# Patient Record
Sex: Female | Born: 1980
Health system: Southern US, Community
[De-identification: ages and names within clinical notes are randomized; demographics above are authoritative.]

## PROBLEM LIST (undated history)

## (undated) DIAGNOSIS — U071 COVID-19: Secondary | ICD-10-CM

## (undated) DIAGNOSIS — D573 Sickle-cell trait: Secondary | ICD-10-CM

## (undated) DIAGNOSIS — Z8619 Personal history of other infectious and parasitic diseases: Secondary | ICD-10-CM

## (undated) HISTORY — PX: SCAR REVISION: SHX5285

## (undated) HISTORY — PX: WISDOM TOOTH EXTRACTION: SHX21

## (undated) HISTORY — PX: BUNIONECTOMY: SHX129

## (undated) HISTORY — DX: COVID-19: U07.1

## (undated) HISTORY — DX: Personal history of other infectious and parasitic diseases: Z86.19

## (undated) HISTORY — DX: Sickle-cell trait: D57.3

---

## 2011-07-16 LAB — GC/CHLAMYDIA PROBE AMP, GENITAL
Chlamydia: NEGATIVE
Gonorrhea: NEGATIVE

## 2011-07-16 LAB — RPR: RPR: NONREACTIVE

## 2011-07-16 LAB — ABO/RH

## 2011-08-02 ENCOUNTER — Other Ambulatory Visit (HOSPITAL_COMMUNITY)
Admission: RE | Admit: 2011-08-02 | Discharge: 2011-08-02 | Disposition: A | Discharge status: Home / Self Care | Payer: 59 | Source: Ambulatory Visit | Attending: Obstetrics and Gynecology | Admitting: Obstetrics and Gynecology

## 2011-08-02 DIAGNOSIS — Z01419 Encounter for gynecological examination (general) (routine) without abnormal findings: Secondary | ICD-10-CM | POA: Insufficient documentation

## 2011-08-02 DIAGNOSIS — Z113 Encounter for screening for infections with a predominantly sexual mode of transmission: Secondary | ICD-10-CM | POA: Insufficient documentation

## 2012-01-21 LAB — STREP B DNA PROBE: GBS: POSITIVE

## 2012-02-11 ENCOUNTER — Encounter (HOSPITAL_COMMUNITY): Payer: Self-pay | Admitting: *Deleted

## 2012-02-11 ENCOUNTER — Telehealth (HOSPITAL_COMMUNITY): Payer: Self-pay | Admitting: *Deleted

## 2012-02-11 NOTE — Telephone Encounter (Signed)
Preadmission screen  

## 2012-02-12 ENCOUNTER — Inpatient Hospital Stay (HOSPITAL_COMMUNITY): Admission: AD | Admit: 2012-02-12 | Payer: Self-pay | Source: Ambulatory Visit | Admitting: Obstetrics and Gynecology

## 2012-02-19 ENCOUNTER — Other Ambulatory Visit: Payer: Self-pay | Admitting: Obstetrics and Gynecology

## 2012-02-19 ENCOUNTER — Encounter (HOSPITAL_COMMUNITY): Payer: Self-pay

## 2012-02-19 ENCOUNTER — Inpatient Hospital Stay (HOSPITAL_COMMUNITY)
Admission: RE | Admit: 2012-02-19 | Discharge: 2012-02-23 | DRG: 766 | Disposition: A | Discharge status: Home / Self Care | Payer: 59 | Source: Ambulatory Visit | Attending: Obstetrics and Gynecology | Admitting: Obstetrics and Gynecology

## 2012-02-19 DIAGNOSIS — O480 Post-term pregnancy: Principal | ICD-10-CM | POA: Diagnosis present

## 2012-02-19 LAB — CBC
MCV: 90.6 fL (ref 78.0–100.0)
Platelets: 246 10*3/uL (ref 150–400)
RBC: 4.38 MIL/uL (ref 3.87–5.11)
RDW: 13.6 % (ref 11.5–15.5)
WBC: 10.5 10*3/uL (ref 4.0–10.5)

## 2012-02-19 MED ORDER — IBUPROFEN 600 MG PO TABS: 600.0000 mg | ORAL_TABLET | Freq: Four times a day (QID) | ORAL | Status: DC | PRN

## 2012-02-19 MED ORDER — CITRIC ACID-SODIUM CITRATE 334-500 MG/5ML PO SOLN
30.0000 mL | ORAL | Status: DC | PRN
  Filled 2012-02-19: qty 15

## 2012-02-19 MED ORDER — LACTATED RINGERS IV SOLN
500.0000 mL | INTRAVENOUS | Status: DC | PRN
  Administered 2012-02-20: 1000 mL via INTRAVENOUS

## 2012-02-19 MED ORDER — ONDANSETRON HCL 4 MG/2ML IJ SOLN: 4.0000 mg | Freq: Four times a day (QID) | INTRAMUSCULAR | Status: DC | PRN

## 2012-02-19 MED ORDER — MISOPROSTOL 25 MCG QUARTER TABLET
25.0000 ug | ORAL_TABLET | ORAL | Status: DC | PRN
  Administered 2012-02-19: 25 ug via VAGINAL
  Filled 2012-02-19: qty 0.25

## 2012-02-19 MED ORDER — ACETAMINOPHEN 325 MG PO TABS: 650.0000 mg | ORAL_TABLET | ORAL | Status: DC | PRN

## 2012-02-19 MED ORDER — OXYTOCIN 20 UNITS IN LACTATED RINGERS INFUSION - SIMPLE: 125.0000 mL/h | Freq: Once | INTRAVENOUS | Status: DC

## 2012-02-19 MED ORDER — BUTORPHANOL TARTRATE 2 MG/ML IJ SOLN
1.0000 mg | INTRAMUSCULAR | Status: DC | PRN
  Administered 2012-02-20 (×2): 1 mg via INTRAVENOUS
  Filled 2012-02-19 (×2): qty 1

## 2012-02-19 MED ORDER — PENICILLIN G POTASSIUM 5000000 UNITS IJ SOLR
2.5000 10*6.[IU] | INTRAVENOUS | Status: DC
  Administered 2012-02-20 (×3): 2.5 10*6.[IU] via INTRAVENOUS
  Filled 2012-02-19 (×5): qty 2.5

## 2012-02-19 MED ORDER — LIDOCAINE HCL (PF) 1 % IJ SOLN: 30.0000 mL | INTRAMUSCULAR | Status: DC | PRN

## 2012-02-19 MED ORDER — LACTATED RINGERS IV SOLN
INTRAVENOUS | Status: DC
  Administered 2012-02-19 – 2012-02-20 (×2): via INTRAVENOUS
  Administered 2012-02-20: 125 mL/h via INTRAVENOUS

## 2012-02-19 MED ORDER — PENICILLIN G POTASSIUM 5000000 UNITS IJ SOLR
5.0000 10*6.[IU] | Freq: Once | INTRAMUSCULAR | Status: AC
  Administered 2012-02-20: 5 10*6.[IU] via INTRAVENOUS
  Filled 2012-02-19: qty 5

## 2012-02-19 MED ORDER — TERBUTALINE SULFATE 1 MG/ML IJ SOLN: 0.2500 mg | Freq: Once | INTRAMUSCULAR | Status: AC | PRN

## 2012-02-19 MED ORDER — FLEET ENEMA 7-19 GM/118ML RE ENEM: 1.0000 | ENEMA | RECTAL | Status: DC | PRN

## 2012-02-19 MED ORDER — OXYTOCIN BOLUS FROM INFUSION
500.0000 mL | Freq: Once | INTRAVENOUS | Status: DC
  Filled 2012-02-19: qty 500

## 2012-02-19 NOTE — Progress Notes (Signed)
Dr. Christell Constant contacted for patient update to include SVE, FHR, and maternal vitals.  Penicillin to start once patient is in active labor.  No new orders received.

## 2012-02-20 ENCOUNTER — Encounter (HOSPITAL_COMMUNITY): Payer: Self-pay

## 2012-02-20 ENCOUNTER — Inpatient Hospital Stay (HOSPITAL_COMMUNITY): Payer: 59 | Admitting: Anesthesiology

## 2012-02-20 ENCOUNTER — Encounter (HOSPITAL_COMMUNITY): Payer: Self-pay | Admitting: Anesthesiology

## 2012-02-20 ENCOUNTER — Encounter (HOSPITAL_COMMUNITY)
Admission: RE | Disposition: A | Discharge status: Home / Self Care | Payer: Self-pay | Source: Ambulatory Visit | Attending: Obstetrics and Gynecology

## 2012-02-20 LAB — RPR: RPR Ser Ql: NONREACTIVE

## 2012-02-20 SURGERY — Surgical Case
Anesthesia: Epidural | Site: Abdomen | Wound class: Clean Contaminated

## 2012-02-20 MED ORDER — LACTATED RINGERS IV SOLN
500.0000 mL | Freq: Once | INTRAVENOUS | Status: AC
  Administered 2012-02-20: 1000 mL via INTRAVENOUS

## 2012-02-20 MED ORDER — CITRIC ACID-SODIUM CITRATE 334-500 MG/5ML PO SOLN
30.0000 mL | Freq: Once | ORAL | Status: AC
  Administered 2012-02-20: 30 mL via ORAL

## 2012-02-20 MED ORDER — SODIUM BICARBONATE 8.4 % IV SOLN
INTRAVENOUS | Status: AC
  Filled 2012-02-20: qty 50

## 2012-02-20 MED ORDER — SODIUM BICARBONATE 8.4 % IV SOLN
INTRAVENOUS | Status: DC | PRN
  Administered 2012-02-20: 5 mL via EPIDURAL

## 2012-02-20 MED ORDER — OXYTOCIN 10 UNIT/ML IJ SOLN
INTRAMUSCULAR | Status: AC
  Filled 2012-02-20: qty 2

## 2012-02-20 MED ORDER — LIDOCAINE-EPINEPHRINE (PF) 2 %-1:200000 IJ SOLN
INTRAMUSCULAR | Status: AC
  Filled 2012-02-20: qty 20

## 2012-02-20 MED ORDER — LACTATED RINGERS IV SOLN
INTRAVENOUS | Status: DC | PRN
  Administered 2012-02-20 – 2012-02-21 (×3): via INTRAVENOUS

## 2012-02-20 MED ORDER — TERBUTALINE SULFATE 1 MG/ML IJ SOLN: 0.2500 mg | Freq: Once | INTRAMUSCULAR | Status: DC | PRN

## 2012-02-20 MED ORDER — CEFAZOLIN SODIUM 1-5 GM-% IV SOLN
INTRAVENOUS | Status: DC | PRN
  Administered 2012-02-20: 1 g via INTRAVENOUS

## 2012-02-20 MED ORDER — OXYTOCIN 20 UNITS IN LACTATED RINGERS INFUSION - SIMPLE
1.0000 m[IU]/min | INTRAVENOUS | Status: DC
  Administered 2012-02-20: 2 m[IU]/min via INTRAVENOUS
  Filled 2012-02-20: qty 1000

## 2012-02-20 MED ORDER — EPHEDRINE 5 MG/ML INJ: 10.0000 mg | INTRAVENOUS | Status: DC | PRN

## 2012-02-20 MED ORDER — METHYLERGONOVINE MALEATE 0.2 MG/ML IJ SOLN
INTRAMUSCULAR | Status: AC
  Filled 2012-02-20: qty 1

## 2012-02-20 MED ORDER — CEFAZOLIN SODIUM 1-5 GM-% IV SOLN
1.0000 g | INTRAVENOUS | Status: DC
  Filled 2012-02-20: qty 50

## 2012-02-20 MED ORDER — OXYTOCIN 20 UNITS IN LACTATED RINGERS INFUSION - SIMPLE
INTRAVENOUS | Status: DC | PRN
  Administered 2012-02-20: 40 [IU] via INTRAVENOUS
  Administered 2012-02-21: 20 [IU] via INTRAVENOUS

## 2012-02-20 MED ORDER — ONDANSETRON HCL 4 MG/2ML IJ SOLN
INTRAMUSCULAR | Status: AC
  Filled 2012-02-20: qty 2

## 2012-02-20 MED ORDER — METHYLERGONOVINE MALEATE 0.2 MG/ML IJ SOLN
INTRAMUSCULAR | Status: DC | PRN
  Administered 2012-02-20: 0.2 mg via INTRAMUSCULAR

## 2012-02-20 MED ORDER — MORPHINE SULFATE 0.5 MG/ML IJ SOLN
INTRAMUSCULAR | Status: AC
  Filled 2012-02-20: qty 10

## 2012-02-20 MED ORDER — MORPHINE SULFATE (PF) 0.5 MG/ML IJ SOLN
INTRAMUSCULAR | Status: DC | PRN
  Administered 2012-02-20: 1 mg via INTRAVENOUS

## 2012-02-20 MED ORDER — PHENYLEPHRINE 40 MCG/ML (10ML) SYRINGE FOR IV PUSH (FOR BLOOD PRESSURE SUPPORT): 80.0000 ug | PREFILLED_SYRINGE | INTRAVENOUS | Status: DC | PRN

## 2012-02-20 MED ORDER — MORPHINE SULFATE (PF) 0.5 MG/ML IJ SOLN
INTRAMUSCULAR | Status: DC | PRN
  Administered 2012-02-20: 4 mg via EPIDURAL

## 2012-02-20 MED ORDER — ONDANSETRON HCL 4 MG/2ML IJ SOLN
INTRAMUSCULAR | Status: DC | PRN
  Administered 2012-02-20: 4 mg via INTRAVENOUS

## 2012-02-20 MED ORDER — DIPHENHYDRAMINE HCL 50 MG/ML IJ SOLN: 12.5000 mg | INTRAMUSCULAR | Status: DC | PRN

## 2012-02-20 MED ORDER — LIDOCAINE HCL (PF) 1 % IJ SOLN
INTRAMUSCULAR | Status: DC | PRN
  Administered 2012-02-20 (×3): 4 mL

## 2012-02-20 MED ORDER — PHENYLEPHRINE 40 MCG/ML (10ML) SYRINGE FOR IV PUSH (FOR BLOOD PRESSURE SUPPORT)
80.0000 ug | PREFILLED_SYRINGE | INTRAVENOUS | Status: DC | PRN
  Filled 2012-02-20: qty 5

## 2012-02-20 MED ORDER — FENTANYL 2.5 MCG/ML BUPIVACAINE 1/10 % EPIDURAL INFUSION (WH - ANES)
14.0000 mL/h | INTRAMUSCULAR | Status: DC
  Administered 2012-02-20 (×3): 14 mL/h via EPIDURAL
  Filled 2012-02-20 (×3): qty 60

## 2012-02-20 SURGICAL SUPPLY — 37 items
BENZOIN TINCTURE PRP APPL 2/3 (GAUZE/BANDAGES/DRESSINGS) ×2 IMPLANT
CLOTH BEACON ORANGE TIMEOUT ST (SAFETY) ×2 IMPLANT
CONTAINER PREFILL 10% NBF 15ML (MISCELLANEOUS) IMPLANT
DRESSING TELFA 8X3 (GAUZE/BANDAGES/DRESSINGS) ×2 IMPLANT
ELECT REM PT RETURN 9FT ADLT (ELECTROSURGICAL) ×2
ELECTRODE REM PT RTRN 9FT ADLT (ELECTROSURGICAL) ×1 IMPLANT
EXTRACTOR VACUUM M CUP 4 TUBE (SUCTIONS) IMPLANT
GAUZE SPONGE 4X4 12PLY STRL LF (GAUZE/BANDAGES/DRESSINGS) ×2 IMPLANT
GLOVE BIOGEL M 6.5 STRL (GLOVE) ×2 IMPLANT
GLOVE BIOGEL PI IND STRL 6.5 (GLOVE) ×2 IMPLANT
GLOVE BIOGEL PI INDICATOR 6.5 (GLOVE) ×2
GOWN PREVENTION PLUS LG XLONG (DISPOSABLE) ×2 IMPLANT
GOWN PREVENTION PLUS XLARGE (GOWN DISPOSABLE) ×2 IMPLANT
KIT ABG SYR 3ML LUER SLIP (SYRINGE) IMPLANT
NEEDLE HYPO 25X5/8 SAFETYGLIDE (NEEDLE) IMPLANT
NS IRRIG 1000ML POUR BTL (IV SOLUTION) ×2 IMPLANT
PACK C SECTION WH (CUSTOM PROCEDURE TRAY) ×2 IMPLANT
PAD ABD 7.5X8 STRL (GAUZE/BANDAGES/DRESSINGS) ×2 IMPLANT
RTRCTR C-SECT PINK 25CM LRG (MISCELLANEOUS) IMPLANT
RTRCTR C-SECT PINK 34CM XLRG (MISCELLANEOUS) IMPLANT
SLEEVE SCD COMPRESS KNEE MED (MISCELLANEOUS) IMPLANT
SPONGE GAUZE 4X4 12PLY (GAUZE/BANDAGES/DRESSINGS) ×2 IMPLANT
STAPLER VISISTAT 35W (STAPLE) IMPLANT
STRIP CLOSURE SKIN 1/2X4 (GAUZE/BANDAGES/DRESSINGS) ×2 IMPLANT
SUT PDS AB 0 CT1 27 (SUTURE) ×4 IMPLANT
SUT PLAIN 0 NONE (SUTURE) IMPLANT
SUT VIC AB 0 CTX 36 (SUTURE) ×3
SUT VIC AB 0 CTX36XBRD ANBCTRL (SUTURE) ×3 IMPLANT
SUT VIC AB 2-0 CT1 27 (SUTURE) ×1
SUT VIC AB 2-0 CT1 TAPERPNT 27 (SUTURE) ×1 IMPLANT
SUT VIC AB 3-0 SH 27 (SUTURE)
SUT VIC AB 3-0 SH 27X BRD (SUTURE) IMPLANT
SUT VIC AB 4-0 KS 27 (SUTURE) ×2 IMPLANT
TAPE CLOTH SURG 4X10 WHT LF (GAUZE/BANDAGES/DRESSINGS) ×2 IMPLANT
TOWEL OR 17X24 6PK STRL BLUE (TOWEL DISPOSABLE) ×4 IMPLANT
TRAY FOLEY CATH 14FR (SET/KITS/TRAYS/PACK) ×2 IMPLANT
WATER STERILE IRR 1000ML POUR (IV SOLUTION) ×2 IMPLANT

## 2012-02-20 NOTE — Progress Notes (Signed)
Dr. Richardson Dopp called to check on patient.  Notified that PCN started and maternal vitals, SVE, UC's and FHR.  No new orders received.  Dr. Richardson Dopp will be in within the hour to evaluate in person.

## 2012-02-20 NOTE — Op Note (Signed)
Cesarean Section Procedure Note  Indications: failure to progress: arrest of dilation  Pre-operative Diagnosis: 41 week 1 day pregnancy.  Post-operative Diagnosis: same  Surgeon: Jessee Avers.   Assistants: Dr. Geryl Rankins   Anesthesia: Epidural anesthesia  ASA Class: 2   Procedure Details   The patient was seen in the Holding Room. The risks, benefits, complications, treatment options, and expected outcomes were discussed with the patient.  The patient concurred with the proposed plan, giving informed consent.  The site of surgery properly noted/marked. The patient was taken to Operating Room # 1, identified as Carolyn Bush and the procedure verified as C-Section Delivery. A Time Out was held and the above information confirmed.  After induction of anesthesia, the patient was draped and prepped in the usual sterile manner. A Pfannenstiel incision was made and carried down through the subcutaneous tissue to the fascia. Fascial incision was made and extended transversely. The fascia was separated from the underlying rectus tissue superiorly and inferiorly. The peritoneum was identified and entered. Peritoneal incision was extended longitudinally. The utero-vesical peritoneal reflection was incised transversely and the bladder flap was bluntly freed from the lower uterine segment. A low transverse uterine incision was made. Delivered from cephalic presentation was Female with Apgar scores of 9 at one minute and 9 at five minutes. After the umbilical cord was clamped and cut cord blood was obtained for evaluation. The placenta was removed intact and appeared normal. The uterine outline, tubes and ovaries appeared normal. The uterine incision was closed with running locked sutures of 0 Vicryl. Hemostasis was observed. A second layer of 0 vicryl was used for imbrication . Lavage was carried out until clear. The fascia was then reapproximated with running sutures of 0 pds. The skin was  reapproximated with 4-0  Vicryl.  Instrument, sponge, and needle counts were correct prior the abdominal closure and at the conclusion of the case.   Findings:  female infant cephalic position LGA Estimated Blood Loss:  750 ml         Drains: Foley catheter         Total IV Fluids:          Specimens: Placenta sent to labor and delivery           Implants: none         Complications:  None; patient tolerated the procedure well.         Disposition: PACU - hemodynamically stable.         Condition: stable  Attending Attestation: I performed the procedure.

## 2012-02-20 NOTE — Anesthesia Procedure Notes (Signed)
Epidural Patient location during procedure: OB Start time: 02/20/2012 11:41 AM Reason for block: procedure for pain  Staffing Performed by: anesthesiologist   Preanesthetic Checklist Completed: patient identified, site marked, surgical consent, pre-op evaluation, timeout performed, IV checked, risks and benefits discussed and monitors and equipment checked  Epidural Patient position: sitting Prep: site prepped and draped and DuraPrep Patient monitoring: continuous pulse ox and blood pressure Approach: midline Injection technique: LOR air  Needle:  Needle type: Tuohy  Needle gauge: 17 G Needle length: 9 cm Catheter type: closed end flexible Catheter size: 19 Gauge Test dose: negative  Assessment Events: blood not aspirated, injection not painful, no injection resistance, negative IV test and no paresthesia  Additional Notes Discussed risk of headache, infection, bleeding, nerve injury and failed or incomplete block.  Patient voices understanding and wishes to proceed.

## 2012-02-20 NOTE — Progress Notes (Signed)
In to assess patient  cx 9/100/0 station with caput FHR baseline 120's good btbv + accels no decels  Toco  Ctx q 2 minutes mvu 210 A/p 41wks and 1 day with arrest of dilatation Recommend cesarean section R/B/A of cesarean section discussed with patient including but not limited to infection bleeding damage to bowel bladder and baby with the need for further surgery. R/O transfusion HIV/ Hep B&C discussed . Pt voiced understanding and desires to proceed with cesarean section.

## 2012-02-20 NOTE — Progress Notes (Signed)
Dr. Christell Constant notified of SVE and UC's.  No new orders received.  Nurse advised to go ahead and start PCN dose for GBS results.

## 2012-02-20 NOTE — Progress Notes (Signed)
In to assess patient. Pt is comfortable with an epidural  AF VSS FHR baseline 120's good btbv +accels no decels Toco ctx q 3-6 min Cx 4/80/-2 IUPC placed without difficulty  A/P 41 wks and 1day postdates  Pitocin for augmentation

## 2012-02-20 NOTE — H&P (Signed)
Carolyn Bush is a 31 y.o. female presenting for  Induction at 41 wks due to post dates. Pt received cytotec 25 mcg over night. + ctx no Lof novaginal bleeding . +FM  OB History    Grav Para Term Preterm Abortions TAB SAB Ect Mult Living   1              Past Medical History  Diagnosis Date  . History of chicken pox   . Sickle cell trait   . Asthma   Medication: PNV.Marland Kitchen Pt's asthma is exercised induced only.. .  History reviewed. No pertinent past surgical history. Family History: family history includes Birth defects in her brother; Seizures in her brother; and Thyroid disease in her maternal grandmother. Social History:  reports that she has never smoked. She does not have any smokeless tobacco history on file. She reports that she does not drink alcohol or use illicit drugs. Surgery : scar revision and wisdom tooth extraction  Ros: Negative except as stated in HPI  ALLErgies NKDA no latex allergy  Dilation: 3 Effacement (%): 70 Station: -1 Exam by:: Bennye Alm, RN Blood pressure 98/63, pulse 65, temperature 97.5 F (36.4 C), temperature source Oral, resp. rate 18, height 5\' 7"  (1.702 m), weight 73.029 kg (161 lb), last menstrual period 04/23/2011. CV rrr  Lungs clear Abd gravid nontender Ext no edema  Cx 3/75/-2 AROM clear fluid    Prenatal labs: ABO, Rh: O/Positive/-- (09/10 0000) Antibody: Negative (09/10 0000) Rubella:  Immune  RPR: NON REACTIVE (04/16 2110)  HBsAg: Negative (09/10 0000)  HIV: Non-reactive (09/10 0000)  GBS: Positive (03/18 0000)   Assessment/Plan: 41 wks post dates for induction  Penicillin for GBS prophylaxis  Arom performed if no change in cervix in 2 hrs will start pitocin Anticipate svd    Morgen Linebaugh J. 02/20/2012, 8:15 AM

## 2012-02-20 NOTE — Progress Notes (Signed)
In to assess patient  Cx 8/100/0 FHR baseline 120's good btbv + accels variable decel Toco ctx q2 minutes  A/P 41 wks and 1 day induction for postdates making progress  pcn for gbs Continue pitocin  Anticipate SVD

## 2012-02-20 NOTE — Anesthesia Preprocedure Evaluation (Signed)
Anesthesia Evaluation  Patient identified by MRN, date of birth, ID band Patient awake    Reviewed: Allergy & Precautions, H&P , NPO status , Patient's Chart, lab work & pertinent test results, reviewed documented beta blocker date and time   History of Anesthesia Complications Negative for: history of anesthetic complications  Airway Mallampati: I TM Distance: >3 FB Neck ROM: full    Dental  (+) Teeth Intact   Pulmonary neg pulmonary ROS,  breath sounds clear to auscultation        Cardiovascular negative cardio ROS  Rhythm:regular Rate:Normal     Neuro/Psych negative neurological ROS  negative psych ROS   GI/Hepatic negative GI ROS, Neg liver ROS,   Endo/Other  negative endocrine ROS  Renal/GU negative Renal ROS     Musculoskeletal   Abdominal   Peds  Hematology negative hematology ROS (+) Sickle cell trait ,   Anesthesia Other Findings   Reproductive/Obstetrics (+) Pregnancy                           Anesthesia Physical Anesthesia Plan  ASA: II  Anesthesia Plan: Epidural   Post-op Pain Management:    Induction:   Airway Management Planned:   Additional Equipment:   Intra-op Plan:   Post-operative Plan:   Informed Consent: I have reviewed the patients History and Physical, chart, labs and discussed the procedure including the risks, benefits and alternatives for the proposed anesthesia with the patient or authorized representative who has indicated his/her understanding and acceptance.     Plan Discussed with:   Anesthesia Plan Comments:         Anesthesia Quick Evaluation

## 2012-02-20 NOTE — Progress Notes (Signed)
In to assess patient Cervix 9/100/ +1 station  FHR baseline 120's good btbv +accels variable decel occasionally Toco ctx q 2 min mvu 190 A/P 41 wks and 1 day post dates  Anticipate svd

## 2012-02-20 NOTE — Progress Notes (Signed)
Dr. Christell Constant notified of cervical exam and UC's.  New order received to wait on 2nd cytotec and re-evaluate at 0315.  May place cytotec at that time if needed and UC's are less than 3 in a 10 minute period.  Will contact Dr. Christell Constant at 0600 with SVE to determine additional orders.

## 2012-02-20 NOTE — Progress Notes (Signed)
In to assess patient  Cx 9/100/0 FHR baseline 120's good btbv +accels occasional variable decel Toco ctx q 2 1/2 minutes  mvu 190 A/P 41 wks 1 day postdates Continue pitocin  Will monitor closely

## 2012-02-21 ENCOUNTER — Encounter (HOSPITAL_COMMUNITY): Payer: Self-pay

## 2012-02-21 LAB — CBC
Hemoglobin: 11.7 g/dL — ABNORMAL LOW (ref 12.0–15.0)
MCH: 30.3 pg (ref 26.0–34.0)
MCV: 90.9 fL (ref 78.0–100.0)
Platelets: 220 10*3/uL (ref 150–400)
RBC: 3.86 MIL/uL — ABNORMAL LOW (ref 3.87–5.11)
WBC: 28.1 10*3/uL — ABNORMAL HIGH (ref 4.0–10.5)

## 2012-02-21 LAB — ABO/RH: ABO/RH(D): O POS

## 2012-02-21 MED ORDER — DIPHENHYDRAMINE HCL 50 MG/ML IJ SOLN: 12.5000 mg | INTRAMUSCULAR | Status: DC | PRN

## 2012-02-21 MED ORDER — SIMETHICONE 80 MG PO CHEW
80.0000 mg | CHEWABLE_TABLET | Freq: Three times a day (TID) | ORAL | Status: DC
  Administered 2012-02-21 – 2012-02-23 (×9): 80 mg via ORAL

## 2012-02-21 MED ORDER — FERROUS SULFATE 325 (65 FE) MG PO TABS
325.0000 mg | ORAL_TABLET | Freq: Two times a day (BID) | ORAL | Status: DC
  Administered 2012-02-21 – 2012-02-23 (×4): 325 mg via ORAL
  Filled 2012-02-21 (×4): qty 1

## 2012-02-21 MED ORDER — METOCLOPRAMIDE HCL 5 MG/ML IJ SOLN: 10.0000 mg | Freq: Three times a day (TID) | INTRAMUSCULAR | Status: DC | PRN

## 2012-02-21 MED ORDER — METHYLERGONOVINE MALEATE 0.2 MG PO TABS: 0.2000 mg | ORAL_TABLET | ORAL | Status: DC | PRN

## 2012-02-21 MED ORDER — KETOROLAC TROMETHAMINE 60 MG/2ML IM SOLN
60.0000 mg | Freq: Once | INTRAMUSCULAR | Status: AC | PRN
  Administered 2012-02-21: 60 mg via INTRAMUSCULAR

## 2012-02-21 MED ORDER — PRENATAL MULTIVITAMIN CH
1.0000 | ORAL_TABLET | Freq: Every day | ORAL | Status: DC
  Administered 2012-02-21 – 2012-02-23 (×3): 1 via ORAL
  Filled 2012-02-21 (×3): qty 1

## 2012-02-21 MED ORDER — KETOROLAC TROMETHAMINE 60 MG/2ML IM SOLN
INTRAMUSCULAR | Status: AC
  Filled 2012-02-21: qty 2

## 2012-02-21 MED ORDER — LANOLIN HYDROUS EX OINT: 1.0000 "application " | TOPICAL_OINTMENT | CUTANEOUS | Status: DC | PRN

## 2012-02-21 MED ORDER — MEPERIDINE HCL 25 MG/ML IJ SOLN: 6.2500 mg | INTRAMUSCULAR | Status: DC | PRN

## 2012-02-21 MED ORDER — SCOPOLAMINE 1 MG/3DAYS TD PT72
1.0000 | MEDICATED_PATCH | Freq: Once | TRANSDERMAL | Status: DC
  Administered 2012-02-21: 1.5 mg via TRANSDERMAL

## 2012-02-21 MED ORDER — ZOLPIDEM TARTRATE 5 MG PO TABS: 5.0000 mg | ORAL_TABLET | Freq: Every evening | ORAL | Status: DC | PRN

## 2012-02-21 MED ORDER — ONDANSETRON HCL 4 MG PO TABS: 4.0000 mg | ORAL_TABLET | ORAL | Status: DC | PRN

## 2012-02-21 MED ORDER — NALBUPHINE SYRINGE 5 MG/0.5 ML
5.0000 mg | INJECTION | INTRAMUSCULAR | Status: DC | PRN
  Filled 2012-02-21: qty 1

## 2012-02-21 MED ORDER — OXYTOCIN 20 UNITS IN LACTATED RINGERS INFUSION - SIMPLE: 125.0000 mL/h | INTRAVENOUS | Status: AC

## 2012-02-21 MED ORDER — WITCH HAZEL-GLYCERIN EX PADS: 1.0000 "application " | MEDICATED_PAD | CUTANEOUS | Status: DC | PRN

## 2012-02-21 MED ORDER — ONDANSETRON HCL 4 MG/2ML IJ SOLN: 4.0000 mg | Freq: Three times a day (TID) | INTRAMUSCULAR | Status: DC | PRN

## 2012-02-21 MED ORDER — KETOROLAC TROMETHAMINE 30 MG/ML IJ SOLN: 30.0000 mg | Freq: Four times a day (QID) | INTRAMUSCULAR | Status: AC | PRN

## 2012-02-21 MED ORDER — SIMETHICONE 80 MG PO CHEW: 80.0000 mg | CHEWABLE_TABLET | ORAL | Status: DC | PRN

## 2012-02-21 MED ORDER — SCOPOLAMINE 1 MG/3DAYS TD PT72
MEDICATED_PATCH | TRANSDERMAL | Status: AC
  Filled 2012-02-21: qty 1

## 2012-02-21 MED ORDER — SODIUM CHLORIDE 0.9 % IV SOLN
1.0000 ug/kg/h | INTRAVENOUS | Status: DC | PRN
  Filled 2012-02-21: qty 2.5

## 2012-02-21 MED ORDER — SODIUM CHLORIDE 0.9 % IJ SOLN: 3.0000 mL | INTRAMUSCULAR | Status: DC | PRN

## 2012-02-21 MED ORDER — DIBUCAINE 1 % RE OINT: 1.0000 "application " | TOPICAL_OINTMENT | RECTAL | Status: DC | PRN

## 2012-02-21 MED ORDER — OXYCODONE-ACETAMINOPHEN 5-325 MG PO TABS: 1.0000 | ORAL_TABLET | ORAL | Status: DC | PRN

## 2012-02-21 MED ORDER — MENTHOL 3 MG MT LOZG: 1.0000 | LOZENGE | OROMUCOSAL | Status: DC | PRN

## 2012-02-21 MED ORDER — NALBUPHINE SYRINGE 5 MG/0.5 ML
5.0000 mg | INJECTION | INTRAMUSCULAR | Status: DC | PRN
  Administered 2012-02-21: 10 mg via SUBCUTANEOUS
  Filled 2012-02-21 (×2): qty 1

## 2012-02-21 MED ORDER — DIPHENHYDRAMINE HCL 25 MG PO CAPS
25.0000 mg | ORAL_CAPSULE | ORAL | Status: DC | PRN
  Administered 2012-02-21: 25 mg via ORAL
  Filled 2012-02-21: qty 1

## 2012-02-21 MED ORDER — SENNOSIDES-DOCUSATE SODIUM 8.6-50 MG PO TABS
2.0000 | ORAL_TABLET | Freq: Every day | ORAL | Status: DC
  Administered 2012-02-21 – 2012-02-22 (×2): 2 via ORAL

## 2012-02-21 MED ORDER — NALOXONE HCL 0.4 MG/ML IJ SOLN: 0.4000 mg | INTRAMUSCULAR | Status: DC | PRN

## 2012-02-21 MED ORDER — ONDANSETRON HCL 4 MG/2ML IJ SOLN: 4.0000 mg | INTRAMUSCULAR | Status: DC | PRN

## 2012-02-21 MED ORDER — METHYLERGONOVINE MALEATE 0.2 MG/ML IJ SOLN
0.2000 mg | INTRAMUSCULAR | Status: DC | PRN
Start: 2012-02-21 — End: 2012-02-23

## 2012-02-21 MED ORDER — DIPHENHYDRAMINE HCL 25 MG PO CAPS: 25.0000 mg | ORAL_CAPSULE | Freq: Four times a day (QID) | ORAL | Status: DC | PRN

## 2012-02-21 MED ORDER — IBUPROFEN 600 MG PO TABS
600.0000 mg | ORAL_TABLET | Freq: Four times a day (QID) | ORAL | Status: DC
  Administered 2012-02-21 – 2012-02-23 (×10): 600 mg via ORAL
  Filled 2012-02-21 (×10): qty 1

## 2012-02-21 MED ORDER — TETANUS-DIPHTH-ACELL PERTUSSIS 5-2.5-18.5 LF-MCG/0.5 IM SUSP: 0.5000 mL | Freq: Once | INTRAMUSCULAR | Status: DC

## 2012-02-21 MED ORDER — DIPHENHYDRAMINE HCL 50 MG/ML IJ SOLN: 25.0000 mg | INTRAMUSCULAR | Status: DC | PRN

## 2012-02-21 MED ORDER — LACTATED RINGERS IV SOLN
INTRAVENOUS | Status: DC
  Administered 2012-02-21: 07:00:00 via INTRAVENOUS

## 2012-02-21 MED ORDER — IBUPROFEN 600 MG PO TABS: 600.0000 mg | ORAL_TABLET | Freq: Four times a day (QID) | ORAL | Status: DC | PRN

## 2012-02-21 MED ORDER — HYDROMORPHONE HCL PF 1 MG/ML IJ SOLN: 0.2500 mg | INTRAMUSCULAR | Status: DC | PRN

## 2012-02-21 NOTE — Anesthesia Postprocedure Evaluation (Signed)
  Anesthesia Post-op Note  Patient: Carolyn Bush  Procedure(s) Performed: Procedure(s) (LRB): CESAREAN SECTION (N/A)  Patient is awake, responsive, moving her legs, and has signs of resolution of her numbness. Pain and nausea are reasonably well controlled. Vital signs are stable and clinically acceptable. Oxygen saturation is clinically acceptable. There are no apparent anesthetic complications at this time. Patient is ready for discharge.

## 2012-02-21 NOTE — Progress Notes (Signed)
Post Partum Day 1 s/p Cesarean Section  Subjective: no complaints, up ad lib, voiding and tolerating PO  Objective: Blood pressure 107/69, pulse 71, temperature 98.3 F (36.8 C), temperature source Oral, resp. rate 18, height 5\' 7"  (1.702 m), weight 73.029 kg (161 lb), last menstrual period 04/23/2011, SpO2 98.00%, unknown if currently breastfeeding.  Physical Exam:  General: alert and cooperative Lochia: appropriate Uterine Fundus: firm Incision: Bandage clean/ dry / intact  DVT Evaluation: No evidence of DVT seen on physical exam.   Basename 02/21/12 0515 02/19/12 2110  HGB 11.7* 13.4  HCT 35.1* 39.7    Assessment/Plan: Breastfeeding and Circumcision prior to discharge I discussed with the patient r/o circumcision including but not limited to infection/ bleeding / damage to penis and poor cosmesis with the need for further surgery. She was informed that this is not medically necessary but for cosmetic purposes only. She voices understanding and desires to proceed with Circumcision    LOS: 2 days   Marvelene Stoneberg J. 02/21/2012, 1:43 PM

## 2012-02-21 NOTE — Transfer of Care (Signed)
Immediate Anesthesia Transfer of Care Note  Patient: Carolyn Bush  Procedure(s) Performed: Procedure(s) (LRB): CESAREAN SECTION (N/A)  Patient Location: PACU  Anesthesia Type: Epidural  Level of Consciousness: awake, alert , oriented and patient cooperative  Airway & Oxygen Therapy: Patient Spontanous Breathing  Post-op Assessment: Report given to PACU RN and Post -op Vital signs reviewed and stable  Post vital signs: Reviewed and stable  Complications: No apparent anesthesia complications

## 2012-02-21 NOTE — Anesthesia Postprocedure Evaluation (Signed)
Anesthesia Post Note  Patient: Carolyn Bush  Procedure(s) Performed: Procedure(s) (LRB): CESAREAN SECTION (N/A)  Anesthesia type: Epidural  Patient location: Mother/Baby  Post pain: Pain level controlled  Post assessment: Post-op Vital signs reviewed  Last Vitals:  Filed Vitals:   02/21/12 0610  BP: 112/71  Pulse: 93  Temp:   Resp: 20    Post vital signs: Reviewed  Level of consciousness:alert  Complications: No apparent anesthesia complications

## 2012-02-21 NOTE — Addendum Note (Signed)
Addendum  created 02/21/12 0745 by Algis Greenhouse, CRNA   Modules edited:Notes Section

## 2012-02-22 NOTE — Discharge Instructions (Signed)

## 2012-02-22 NOTE — Progress Notes (Signed)
Subjective: Postpartum Day 2: Cesarean Delivery Patient reports tolerating PO and no problems voiding.  Pain controlled with medications.  Breast feeding well.  Objective: Vital signs in last 24 hours: Temp:  [97.5 F (36.4 C)-98.4 F (36.9 C)] 97.5 F (36.4 C) (04/19 0549) Pulse Rate:  [65-78] 65  (04/19 0549) Resp:  [16-20] 18  (04/19 0549) BP: (94-111)/(54-70) 94/54 mmHg (04/19 0549) SpO2:  [99 %] 99 % (04/18 2150)  Physical Exam:  General: alert, cooperative and no distress Lochia: Not assessed at perineum Uterine Fundus: firm Incision: no significant drainage, steri strips minimally soiled DVT Evaluation: No cords or calf tenderness. Calf/Ankle edema is present.   Basename 02/21/12 0515 02/19/12 2110  HGB 11.7* 13.4  HCT 35.1* 39.7    Assessment/Plan: Status post Cesarean section. Doing well postoperatively.  Continue current care. Discharge home tomorrow. Encouraged ambulation in halls.   Carolyn Bush 02/22/2012, 8:02 AM

## 2012-02-23 NOTE — Progress Notes (Signed)
Subjective: Postpartum Day 3: Cesarean Delivery Patient reports tolerating PO, + flatus, + BM and no problems voiding.    Objective: Vital signs in last 24 hours: Temp:  [97.9 F (36.6 C)-98.9 F (37.2 C)] 98.3 F (36.8 C) (04/20 0517) Pulse Rate:  [83-91] 91  (04/20 0517) Resp:  [17-20] 17  (04/20 0517) BP: (100-112)/(62-73) 100/62 mmHg (04/20 0517) SpO2:  [98 %] 98 % (04/19 2129)  Physical Exam:  General: alert, cooperative and no distress Lochia: inappropriate Uterine Fundus: firm Incision: healing well, no significant drainage, no dehiscence, no significant erythema,  DVT Evaluation: No evidence of DVT seen on physical exam. Negative Homan's sign.   Basename 02/21/12 0515  HGB 11.7*  HCT 35.1*    Assessment/Plan: Status post Cesarean section. Doing well postoperatively.  Discharge home with standard precautions and return to clinic in 4-6 weeks and in 2 weeks.  Terrill Alperin H. 02/23/2012, 11:50 AM

## 2012-03-05 NOTE — Discharge Summary (Addendum)
Obstetric Discharge Summary Reason for Admission: induction of labor and 41 weeks  Prenatal Procedures: none Intrapartum Procedures: cesarean: low cervical, transverse Postpartum Procedures: none Complications-Operative and Postpartum: none Hemoglobin  Date Value Range Status  02/21/2012 11.7* 12.0-15.0 (g/dL) Final     HCT  Date Value Range Status  02/21/2012 35.1* 36.0-46.0 (%) Final    Physical Exam:  General: alert, cooperative and no distress Lochia: appropriate Uterine Fundus: firm Incision: healing well, no significant drainage, no dehiscence, no significant erythema DVT Evaluation: No evidence of DVT seen on physical exam. Negative Homan's sign.  Discharge Diagnoses: Term Pregnancy-delivered s/p CSx  Discharge Information: Date: 02/23/2012 Activity: pelvic rest Diet: routine Medications: PNV, Ibuprofen and Percocet Condition: stable Instructions: refer to practice specific booklet Discharge to: home Follow-up Information    Follow up with Jessee Avers., MD. Schedule an appointment as soon as possible for a visit in 2 weeks. (incision check )    Contact information:   [address] [place] [address] [phone]          Newborn Data: Live born female  Birth Weight: 9 lb 5 oz (4225 g) APGAR: 9, 9  Home with mother.  Kiyara Bouffard H. 03/05/2012, 1:20 PM

## 2014-04-28 ENCOUNTER — Other Ambulatory Visit: Payer: Self-pay | Admitting: Physician Assistant

## 2014-04-28 ENCOUNTER — Ambulatory Visit
Admission: RE | Admit: 2014-04-28 | Discharge: 2014-04-28 | Disposition: A | Discharge status: Home / Self Care | Payer: 59 | Source: Ambulatory Visit | Attending: Physician Assistant | Admitting: Physician Assistant

## 2014-04-28 DIAGNOSIS — M79671 Pain in right foot: Secondary | ICD-10-CM

## 2014-06-24 ENCOUNTER — Other Ambulatory Visit (HOSPITAL_COMMUNITY)
Admission: RE | Admit: 2014-06-24 | Discharge: 2014-06-24 | Disposition: A | Discharge status: Home / Self Care | Payer: 59 | Source: Ambulatory Visit | Attending: Obstetrics and Gynecology | Admitting: Obstetrics and Gynecology

## 2014-06-24 ENCOUNTER — Other Ambulatory Visit: Payer: Self-pay | Admitting: Obstetrics and Gynecology

## 2014-06-24 DIAGNOSIS — Z1151 Encounter for screening for human papillomavirus (HPV): Secondary | ICD-10-CM | POA: Diagnosis present

## 2014-06-24 DIAGNOSIS — Z01419 Encounter for gynecological examination (general) (routine) without abnormal findings: Secondary | ICD-10-CM | POA: Diagnosis not present

## 2014-06-25 LAB — CYTOLOGY - PAP

## 2014-09-06 ENCOUNTER — Encounter (HOSPITAL_COMMUNITY): Payer: Self-pay

## 2015-03-15 IMAGING — CR DG FOOT 2V*R*
2 series · 2 of 2 positions shown · non-contrast
Comparison: None.

CLINICAL DATA: 33-year-old female with right foot pain.

EXAM:
RIGHT FOOT - 2 VIEW

[view not recorded (1 of 2)]
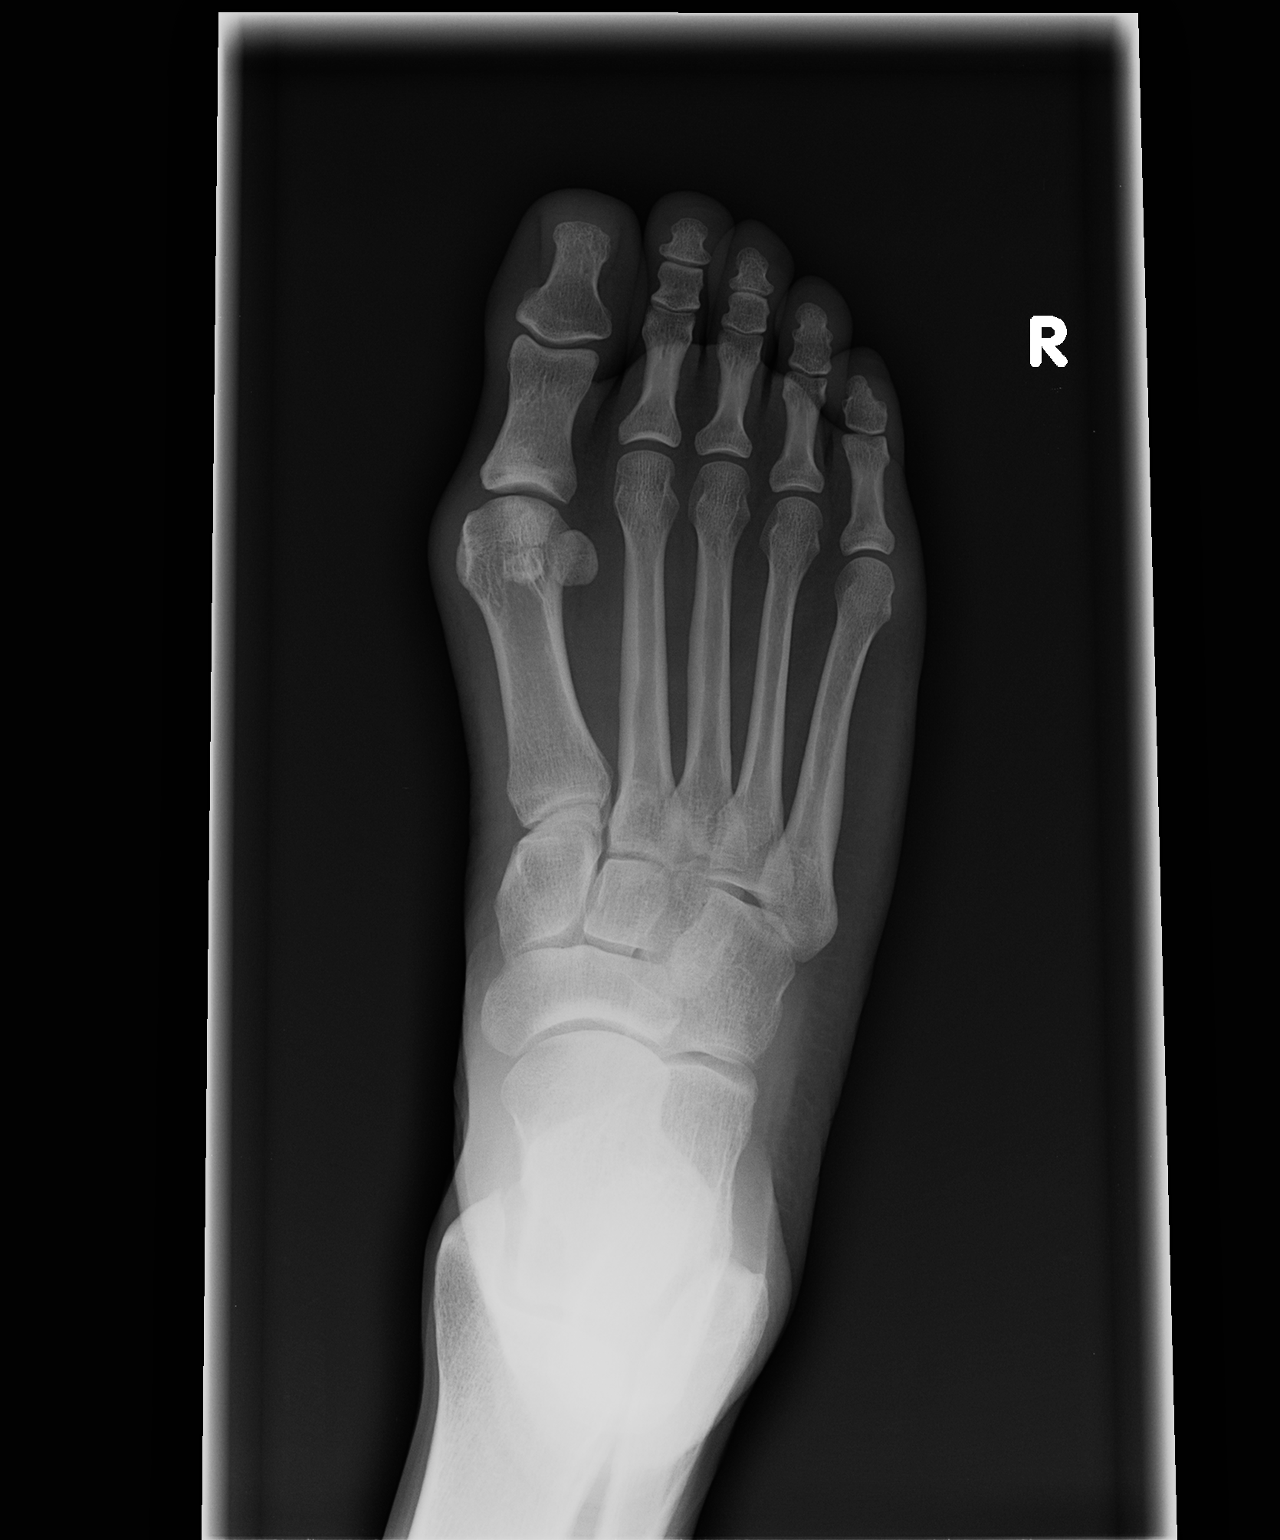

[view not recorded (2 of 2)]
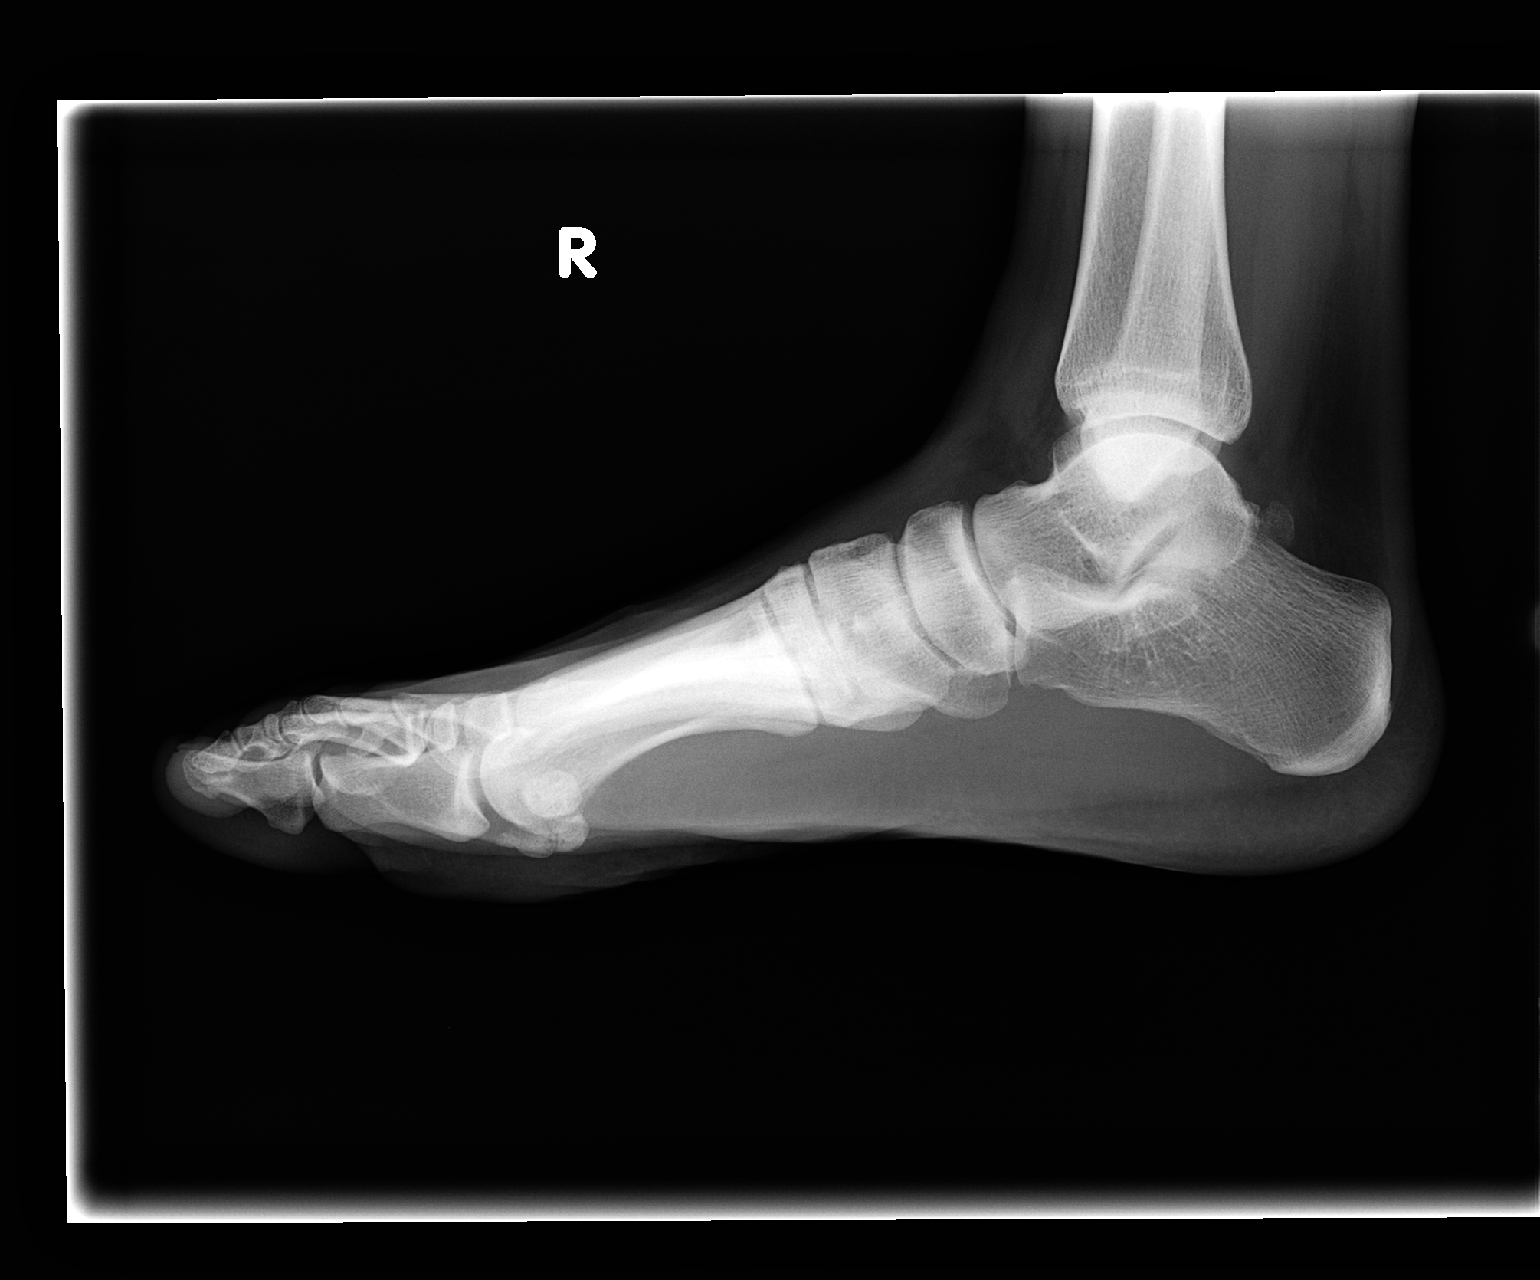

[2 of 2 positions shown; findings below may reference images not displayed]

FINDINGS: There is no evidence of fracture or dislocation. There is no
evidence of arthropathy or other focal bone abnormality. Soft
tissues are unremarkable.
IMPRESSION: Negative.

## 2015-08-08 ENCOUNTER — Other Ambulatory Visit (HOSPITAL_COMMUNITY)
Admission: RE | Admit: 2015-08-08 | Discharge: 2015-08-08 | Disposition: A | Discharge status: Home / Self Care | Payer: 59 | Source: Ambulatory Visit | Attending: Obstetrics and Gynecology | Admitting: Obstetrics and Gynecology

## 2015-08-08 ENCOUNTER — Other Ambulatory Visit: Payer: Self-pay | Admitting: Obstetrics and Gynecology

## 2015-08-08 DIAGNOSIS — Z01419 Encounter for gynecological examination (general) (routine) without abnormal findings: Secondary | ICD-10-CM | POA: Diagnosis present

## 2015-08-09 LAB — CYTOLOGY - PAP

## 2015-11-29 DIAGNOSIS — J069 Acute upper respiratory infection, unspecified: Secondary | ICD-10-CM | POA: Diagnosis not present

## 2015-12-28 DIAGNOSIS — J101 Influenza due to other identified influenza virus with other respiratory manifestations: Secondary | ICD-10-CM | POA: Diagnosis not present

## 2015-12-28 DIAGNOSIS — R52 Pain, unspecified: Secondary | ICD-10-CM | POA: Diagnosis not present

## 2015-12-28 MED FILL — OSELTAMIVIR PHOS 75 MG CAP: 75 | 5 days supply | Qty: 10 | Fill #0

## 2016-01-30 DIAGNOSIS — Z111 Encounter for screening for respiratory tuberculosis: Secondary | ICD-10-CM | POA: Diagnosis not present

## 2016-08-13 DIAGNOSIS — Z01419 Encounter for gynecological examination (general) (routine) without abnormal findings: Secondary | ICD-10-CM | POA: Diagnosis not present

## 2016-08-13 DIAGNOSIS — N913 Primary oligomenorrhea: Secondary | ICD-10-CM | POA: Diagnosis not present

## 2016-10-16 MED FILL — LEVONOR-ETH ESTRAD 0.1-0.02: 0.1-20 | 84 days supply | Qty: 84 | Fill #0

## 2016-10-19 DIAGNOSIS — Z23 Encounter for immunization: Secondary | ICD-10-CM | POA: Diagnosis not present

## 2016-12-05 ENCOUNTER — Ambulatory Visit (INDEPENDENT_AMBULATORY_CARE_PROVIDER_SITE_OTHER): Payer: 59 | Admitting: Podiatry

## 2016-12-05 ENCOUNTER — Ambulatory Visit (INDEPENDENT_AMBULATORY_CARE_PROVIDER_SITE_OTHER): Payer: 59

## 2016-12-05 ENCOUNTER — Encounter: Payer: Self-pay | Admitting: Podiatry

## 2016-12-05 VITALS — BP 98/61 | HR 61 | Resp 16 | Ht 67.5 in | Wt 129.0 lb

## 2016-12-05 DIAGNOSIS — M2010 Hallux valgus (acquired), unspecified foot: Secondary | ICD-10-CM

## 2016-12-05 DIAGNOSIS — M21619 Bunion of unspecified foot: Secondary | ICD-10-CM

## 2016-12-05 NOTE — Progress Notes (Signed)
   Subjective:    Patient ID: Carolyn Bush, female    DOB: 08-31-1981, 36 y.o.   MRN: [id number]  HPI  Chief Complaint  Patient presents with  . Bunions    BL; Left is worse than right. Pt states that the left bunion has progressively gotten worse over the past year. Pt states that she has "pain when walking or running"       Review of Systems     Objective:   Physical Exam        Assessment & Plan:

## 2016-12-05 NOTE — Patient Instructions (Addendum)
Bunionectomy A bunionectomy is a surgical procedure to remove a bunion. A bunion is a visible bump of bone on the inside of your foot where your big toe meets the rest of your foot. A bunion can develop when pressure turns this bone (first metatarsal) toward the other toes. Shoes that are too tight are the most common cause of bunions. Bunions can also be caused by diseases, such as arthritis and polio. You may need a bunionectomy if your bunion is very large and painful or it affects your ability to walk. Tell a health care provider about:  Any allergies you have.  All medicines you are taking, including vitamins, herbs, eye drops, creams, and over-the-counter medicines.  Any problems you or family members have had with anesthetic medicines.  Any blood disorders you have.  Any surgeries you have had.  Any medical conditions you have. What are the risks? Generally, this is a safe procedure. However, problems may occur, including:  Infection.  Pain.  Nerve damage.  Bleeding or blood clots.  Reactions to medicines.  Numbness, stiffness, or arthritis in your toe.  Foot problems that continue even after the procedure. What happens before the procedure?  Ask your health care provider about:  Changing or stopping your regular medicines. This is especially important if you are taking diabetes medicines or blood thinners.  Taking medicines such as aspirin and ibuprofen. These medicines can thin your blood. Do not take these medicines before your procedure if your health care provider instructs you not to.  Do not drink alcohol before the procedure as directed by your health care provider.  Do not use tobacco products, including cigarettes, chewing tobacco, or electronic cigarettes, before the procedure as directed by your health care provider. If you need help quitting, ask your health care provider.  Ask your health care provider what kind of medicine you will be given during  your procedure. A bunionectomy may be done using one of these:  A medicine that numbs the area (local anesthetic).  A medicine that makes you go to sleep (general anesthetic). If you will be given general anesthetic, do not eat or drink anything after midnight on the night before the procedure or as directed by your health care provider. What happens during the procedure?  An IV tube may be inserted into a vein.  You will be given local anesthetic or general anesthetic.  The surgeon will make a cut (incision) over the enlarged area at the first joint of the big toe. The surgeon will remove the bunion.  You may have more than one incision if any of the bones in your big toe need to be moved. A bone itself may need to be cut.  Sometimes the tissues around the big toe may also need to be cut then tightened or loosened to reposition the toe.  Screws or other hardware may be used to keep your foot in thecorrect position.  The incision will be closed with stitches (sutures) and covered with adhesive strips or another type of bandage (dressing). What happens after the procedure?  You may spend some time in a recovery area.  Your blood pressure, heart rate, breathing rate, and blood oxygen level will be monitored often until the medicines you were given have worn off. This information is not intended to replace advice given to you by your health care provider. Make sure you discuss any questions you have with your health care provider. Document Released: 10/05/2005 Document Revised: 03/29/2016 Document Reviewed: 06/09/2014   Elsevier Interactive Patient Education  2017 [employer]  Bunionectomy A bunionectomy is a surgical procedure to remove a bunion. A bunion is a visible bump of bone on the inside of your foot where your big toe meets the rest of your foot. A bunion can develop when pressure turns this bone (first metatarsal) toward the other toes. Shoes that are too tight are the most  common cause of bunions. Bunions can also be caused by diseases, such as arthritis and polio. You may need a bunionectomy if your bunion is very large and painful or it affects your ability to walk. Tell a health care provider about:  Any allergies you have.  All medicines you are taking, including vitamins, herbs, eye drops, creams, and over-the-counter medicines.  Any problems you or family members have had with anesthetic medicines.  Any blood disorders you have.  Any surgeries you have had.  Any medical conditions you have. What are the risks? Generally, this is a safe procedure. However, problems may occur, including:  Infection.  Pain.  Nerve damage.  Bleeding or blood clots.  Reactions to medicines.  Numbness, stiffness, or arthritis in your toe.  Foot problems that continue even after the procedure. What happens before the procedure?  Ask your health care provider about:  Changing or stopping your regular medicines. This is especially important if you are taking diabetes medicines or blood thinners.  Taking medicines such as aspirin and ibuprofen. These medicines can thin your blood. Do not take these medicines before your procedure if your health care provider instructs you not to.  Do not drink alcohol before the procedure as directed by your health care provider.  Do not use tobacco products, including cigarettes, chewing tobacco, or electronic cigarettes, before the procedure as directed by your health care provider. If you need help quitting, ask your health care provider.  Ask your health care provider what kind of medicine you will be given during your procedure. A bunionectomy may be done using one of these:  A medicine that numbs the area (local anesthetic).  A medicine that makes you go to sleep (general anesthetic). If you will be given general anesthetic, do not eat or drink anything after midnight on the night before the procedure or as directed by  your health care provider. What happens during the procedure?  An IV tube may be inserted into a vein.  You will be given local anesthetic or general anesthetic.  The surgeon will make a cut (incision) over the enlarged area at the first joint of the big toe. The surgeon will remove the bunion.  You may have more than one incision if any of the bones in your big toe need to be moved. A bone itself may need to be cut.  Sometimes the tissues around the big toe may also need to be cut then tightened or loosened to reposition the toe.  Screws or other hardware may be used to keep your foot in thecorrect position.  The incision will be closed with stitches (sutures) and covered with adhesive strips or another type of bandage (dressing). What happens after the procedure?  You may spend some time in a recovery area.  Your blood pressure, heart rate, breathing rate, and blood oxygen level will be monitored often until the medicines you were given have worn off. This information is not intended to replace advice given to you by your health care provider. Make sure you discuss any questions you have with your health care   provider. Document Released: 10/05/2005 Document Revised: 03/29/2016 Document Reviewed: 06/09/2014 Elsevier Interactive Patient Education  2017 [employer]  

## 2016-12-05 NOTE — Progress Notes (Signed)
Subjective:     Patient ID: Carolyn Bush, female   DOB: 15-Jul-1981, 36 y.o.   MRN: [id number]  HPI patient presents stating she has painful bunion deformity left over right and it's been gradually getting worse over the last year. States she's tried wider shoes and she's tried soaks without relief of symptoms   Review of Systems  All other systems reviewed and are negative.      Objective:   Physical Exam  Constitutional: She is oriented to person, place, and time.  Cardiovascular: Intact distal pulses.   Musculoskeletal: Normal range of motion.  Neurological: She is oriented to person, place, and time.  Skin: Skin is warm.  Nursing note and vitals reviewed.  neurovascular status was found to be intact muscle strength was adequate range of motion within normal limits with patient found have hyperostosis medial aspect first metatarsal head left over right with deviation of the hallux against the second toe. There is redness around the first metatarsal head and it is painful when palpated and it appears that may also be some nerve compression. Patient is found to have good digital perfusion well oriented 3     Assessment:     Structural bunion deformity left over right with probable nerve compression and deviation hallux against the second toe bilateral    Plan:     H&P conditions reviewed and discussed structural deformity and the fact long-term I want her in orthotics. We discussed correction and I do think an aggressive distal osteotomy while not giving complete radiographic correction should give her excellent clinical correction and allow her to stay weightbearing and hopefully be back issues in 4-5 weeks. She wants surgery will have the left one done first and we'll decide on schedule and call to schedule and I will see her prior to procedure and I've also recommended long-term a leg block which I think will be beneficial at the time of surgery  X-ray report indicates elevation  of the intermetatarsal angle of approximate 16-17 left 15 right with deviation the hallux bilateral

## 2016-12-07 ENCOUNTER — Telehealth: Payer: Self-pay | Admitting: *Deleted

## 2016-12-07 NOTE — Telephone Encounter (Signed)
"  I need to schedule my surgery with Dr. Paulla Dolly."  He does surgery on Tuesdays.  Do you have a date in mind?  "Let's do it on March 13."  Surgery can be done on March 13, I'll get it scheduled.  You will need to schedule an appointment with Dr. Paulla Dolly for a consultation to sign consent forms.  I transferred patient to a scheduler for an appointment.

## 2016-12-19 ENCOUNTER — Ambulatory Visit (INDEPENDENT_AMBULATORY_CARE_PROVIDER_SITE_OTHER): Payer: 59 | Admitting: Podiatry

## 2016-12-19 DIAGNOSIS — M2010 Hallux valgus (acquired), unspecified foot: Secondary | ICD-10-CM | POA: Diagnosis not present

## 2016-12-19 NOTE — Patient Instructions (Signed)

## 2016-12-20 NOTE — Progress Notes (Signed)
Subjective:     Patient ID: Carolyn Bush, female   DOB: 12-Apr-1981, 36 y.o.   MRN: [id number]  HPI patient presents stating I have this painful bunion left that I'm excited to get corrected and I'm here to review   Review of Systems     Objective:   Physical Exam Neurovascular status intact muscle strength adequate with patient found to have large hyperostosis medial aspect first metatarsal head left with redness and pain when palpated    Assessment:     Structural bunion deformity left    Plan:     Condition and x-ray reviewed with patient and recommended correction. I explained the procedure and risk and reviewed alternative treatments complications and patient wants surgery signed consent form after extensive review. Patient stands recovery can take approximate 6 months with no long-term guarantees and we may but not be able to get complete correction and she completely understands this and at this time I did dispense air fracture walker and instructions for surgery and postoperative period.

## 2017-01-15 ENCOUNTER — Encounter: Payer: Self-pay | Admitting: Podiatry

## 2017-01-15 DIAGNOSIS — D573 Sickle-cell trait: Secondary | ICD-10-CM | POA: Diagnosis not present

## 2017-01-15 DIAGNOSIS — M2012 Hallux valgus (acquired), left foot: Secondary | ICD-10-CM | POA: Diagnosis not present

## 2017-01-15 DIAGNOSIS — M21612 Bunion of left foot: Secondary | ICD-10-CM | POA: Diagnosis not present

## 2017-01-15 MED FILL — ONDANSETRON HCL 4 MG TABLET: 4 | 7 days supply | Qty: 20 | Fill #0

## 2017-01-15 MED FILL — OXYCODONE-APAP 10-325: 10-325 | 4 days supply | Qty: 25 | Fill #0

## 2017-01-23 ENCOUNTER — Ambulatory Visit (INDEPENDENT_AMBULATORY_CARE_PROVIDER_SITE_OTHER): Payer: 59 | Admitting: Podiatry

## 2017-01-23 ENCOUNTER — Ambulatory Visit (INDEPENDENT_AMBULATORY_CARE_PROVIDER_SITE_OTHER): Payer: 59

## 2017-01-23 ENCOUNTER — Encounter: Payer: Self-pay | Admitting: Podiatry

## 2017-01-23 VITALS — BP 121/75 | HR 71 | Temp 97.4°F | Resp 16

## 2017-01-23 DIAGNOSIS — Z9889 Other specified postprocedural states: Secondary | ICD-10-CM

## 2017-01-23 DIAGNOSIS — M2010 Hallux valgus (acquired), unspecified foot: Secondary | ICD-10-CM

## 2017-01-23 NOTE — Progress Notes (Signed)
Subjective:     Patient ID: Carolyn Bush, female   DOB: July 26, 1981, 36 y.o.   MRN: [id number]  HPI patient presents stating that she's doing very well with her bunion and she's extremely happy with minimal discomfort or swelling   Review of Systems     Objective:   Physical Exam Neurovascular status intact negative Homans sign was noted with patient's left foot doing well with good alignment wound edges well coapted and good range of motion    Assessment:     Doing well post surgery left foot    Plan:     X-ray reviewed and allow patient continue with complete immobilization compression range of motion exercises and elevation. Discussed ibuprofen as needed and reappoint 3 weeks or earlier if needed  X-ray looks good as far as excellent correction there could be a very small amount of dorsal excursion but it appears to be minimal and the pins are holding well. Excellent underlying correction of deformity

## 2017-02-14 ENCOUNTER — Ambulatory Visit (INDEPENDENT_AMBULATORY_CARE_PROVIDER_SITE_OTHER): Payer: 59 | Admitting: Podiatry

## 2017-02-14 ENCOUNTER — Ambulatory Visit (INDEPENDENT_AMBULATORY_CARE_PROVIDER_SITE_OTHER): Payer: 59

## 2017-02-14 DIAGNOSIS — Z9889 Other specified postprocedural states: Secondary | ICD-10-CM

## 2017-02-14 DIAGNOSIS — M2010 Hallux valgus (acquired), unspecified foot: Secondary | ICD-10-CM

## 2017-02-17 NOTE — Progress Notes (Signed)
Subjective:     Patient ID: Carolyn Bush, female   DOB: 05-May-1981, 36 y.o.   MRN: [id number]  HPI patient points to left foot stating she's doing extremely well with her previous bunion correction with good alignment noted and mild swelling   Review of Systems     Objective:   Physical Exam  Neurovascular status intact negative Homans sign noted with well healed first metatarsal left with wound edges well coapted good alignment and range of motion    Assessment:     Doing well post osteotomy left    Plan:     H&P condition reviewed and recommended continued conservative care with gradual increase in shoe gear usage. Patient will be seen back for [place] to recheck again as needed and wants to get the right one done at one point in the future  X-ray indicates the osteotomy is healed well with no movement with good alignment noted

## 2017-03-22 NOTE — Progress Notes (Signed)
DOS 01/15/2017 [place] Bunionectomy (cutting and moving bone) with pin fixation left foot

## 2017-03-27 ENCOUNTER — Encounter: Payer: Self-pay | Admitting: Podiatry

## 2017-03-27 ENCOUNTER — Ambulatory Visit (INDEPENDENT_AMBULATORY_CARE_PROVIDER_SITE_OTHER): Payer: Self-pay | Admitting: Podiatry

## 2017-03-27 ENCOUNTER — Ambulatory Visit (INDEPENDENT_AMBULATORY_CARE_PROVIDER_SITE_OTHER): Payer: 59

## 2017-03-27 VITALS — BP 93/61 | HR 63 | Resp 16

## 2017-03-27 DIAGNOSIS — M2010 Hallux valgus (acquired), unspecified foot: Secondary | ICD-10-CM

## 2017-03-30 NOTE — Progress Notes (Signed)
Subjective:    Patient ID: Carolyn Bush, female   DOB: 36 y.o.   MRN: [id number]   HPI patient states she's doing real well with minimal discomfort if she does a lot of walking but overall better    ROS      Objective:  Physical Exam Neurovascular status intact negative Homans sign noted with well coapted incision first metatarsal left with good alignment of the joint and minimal pain upon range of motion    Assessment:     Doing well post osteotomy first metatarsal left    Plan:    X-rays reviewed with patient and allow patient to gradually increase activity over the next 6 weeks and may start hopefully running in the next 6 date weeks but will walk until that time  X-rays indicate osteotomies healing well with fixation in place and good joint congruence motion

## 2017-05-17 DIAGNOSIS — Z3481 Encounter for supervision of other normal pregnancy, first trimester: Secondary | ICD-10-CM | POA: Diagnosis not present

## 2017-05-17 DIAGNOSIS — Z3480 Encounter for supervision of other normal pregnancy, unspecified trimester: Secondary | ICD-10-CM | POA: Diagnosis not present

## 2017-05-17 LAB — OB RESULTS CONSOLE ABO/RH: RH TYPE: POSITIVE

## 2017-05-17 LAB — OB RESULTS CONSOLE RPR: RPR: NONREACTIVE

## 2017-05-17 LAB — OB RESULTS CONSOLE RUBELLA ANTIBODY, IGM: Rubella: IMMUNE

## 2017-05-17 LAB — OB RESULTS CONSOLE HIV ANTIBODY (ROUTINE TESTING): HIV: NONREACTIVE

## 2017-05-17 LAB — OB RESULTS CONSOLE HEPATITIS B SURFACE ANTIGEN: Hepatitis B Surface Ag: NEGATIVE

## 2017-05-17 LAB — OB RESULTS CONSOLE ANTIBODY SCREEN: Antibody Screen: NEGATIVE

## 2017-05-22 ENCOUNTER — Other Ambulatory Visit: Payer: Self-pay | Admitting: Obstetrics and Gynecology

## 2017-05-22 ENCOUNTER — Other Ambulatory Visit (HOSPITAL_COMMUNITY)
Admission: RE | Admit: 2017-05-22 | Discharge: 2017-05-22 | Disposition: A | Discharge status: Home / Self Care | Payer: 59 | Source: Ambulatory Visit | Attending: Obstetrics and Gynecology | Admitting: Obstetrics and Gynecology

## 2017-05-22 DIAGNOSIS — Z124 Encounter for screening for malignant neoplasm of cervix: Secondary | ICD-10-CM | POA: Insufficient documentation

## 2017-05-23 LAB — CYTOLOGY - PAP
CHLAMYDIA, DNA PROBE: NEGATIVE
DIAGNOSIS: NEGATIVE
Neisseria Gonorrhea: NEGATIVE

## 2017-05-28 DIAGNOSIS — Z3A10 10 weeks gestation of pregnancy: Secondary | ICD-10-CM | POA: Diagnosis not present

## 2017-05-28 DIAGNOSIS — O09521 Supervision of elderly multigravida, first trimester: Secondary | ICD-10-CM | POA: Diagnosis not present

## 2017-05-28 DIAGNOSIS — Z3687 Encounter for antenatal screening for uncertain dates: Secondary | ICD-10-CM | POA: Diagnosis not present

## 2017-07-23 ENCOUNTER — Encounter: Payer: Self-pay | Admitting: Podiatry

## 2017-07-23 ENCOUNTER — Ambulatory Visit (INDEPENDENT_AMBULATORY_CARE_PROVIDER_SITE_OTHER): Payer: 59 | Admitting: Podiatry

## 2017-07-23 DIAGNOSIS — L600 Ingrowing nail: Secondary | ICD-10-CM | POA: Diagnosis not present

## 2017-07-23 NOTE — Patient Instructions (Signed)

## 2017-07-25 NOTE — Progress Notes (Signed)
Subjective: Sande presents the office they for concerns of ingrown left big toe along the medial aspect. She states this been on left over week and she states the point where she can no longer wear shoes it is hurting every step that she walks. She states is certainly get swollen and red and she has noticed a small amount of clear drainage. Denies any systemic complaints such as fevers, chills, nausea, vomiting. No acute changes since last appointment, and no other complaints at this time.   Objective: AAO x3, NAD DP/PT pulses palpable bilaterally, CRT less than 3 seconds There is incurvation present along the left hallux toenail with tenderness palpation along th nail border. There is localized edema and erythema along the nail corner there is no ascending saline as. Small metacarpal the granulation tissue is present within the nail corners well. Tenderness palpation of this area. No other areas of tenderness identified and there is no pain or significant incurvation to the other nail corner.  No open lesions or pre-ulcerative lesions.  No pain with calf compression, swelling, warmth, erythema  Assessment: Ingrown toenail left hallux   Plan: -All treatment options discussed with the patient including all alternatives, risks, complications.  -At this time, recommended partial nail removal without chemical matricectomy to the left hallux due to infection. Risks and complications were discussed with the patient for which they understand and written consent was obtained. Under sterile conditions a total of 3 mL of a mixture of 2% lidocaine plain and 0.5% Marcaine plain was infiltrated in a hallux block fashion. Once anesthetized, the skin was prepped in sterile fashion. A tourniquet was then applied. Next the symptomatic border of the hallux nail border was sharply excised making sure to remove the entire offending nail border. Once the nail was  Removed, the area was debrided and the underlying skin  was intact. The area was irrigated and hemostasis was obtained.  A dry sterile dressing was applied. After application of the dressing the tourniquet was removed and there is found to be an immediate capillary refill time to the digit. The patient tolerated the procedure well any complications. Post procedure instructions were discussed the patient for which he verbally understood. Follow-up in one week for nail check or sooner if any problems are to arise. Discussed signs/symptoms of worsening infection and directed to call the office immediately should any occur or go directly to the emergency room. In the meantime, encouraged to call the office with any questions, concerns, changes symptoms.  Celesta Gentile, DPM

## 2017-07-29 DIAGNOSIS — Z3482 Encounter for supervision of other normal pregnancy, second trimester: Secondary | ICD-10-CM | POA: Diagnosis not present

## 2017-07-29 DIAGNOSIS — Z36 Encounter for antenatal screening for chromosomal anomalies: Secondary | ICD-10-CM | POA: Diagnosis not present

## 2017-07-29 DIAGNOSIS — O358XX0 Maternal care for other (suspected) fetal abnormality and damage, not applicable or unspecified: Secondary | ICD-10-CM | POA: Diagnosis not present

## 2017-07-29 DIAGNOSIS — O09522 Supervision of elderly multigravida, second trimester: Secondary | ICD-10-CM | POA: Diagnosis not present

## 2017-07-30 ENCOUNTER — Encounter: Payer: Self-pay | Admitting: Podiatry

## 2017-07-30 ENCOUNTER — Ambulatory Visit (INDEPENDENT_AMBULATORY_CARE_PROVIDER_SITE_OTHER): Payer: 59 | Admitting: Podiatry

## 2017-07-30 DIAGNOSIS — L600 Ingrowing nail: Secondary | ICD-10-CM

## 2017-07-30 NOTE — Patient Instructions (Signed)

## 2017-07-31 DIAGNOSIS — L600 Ingrowing nail: Secondary | ICD-10-CM | POA: Insufficient documentation

## 2017-07-31 NOTE — Progress Notes (Signed)
Subjective: Carolyn Bush is a 36 y.o.  female returns to office today for follow up evaluation after having left Hallux partial nail avulsion performed. Patient has been soaking using epsom salts and applying topical antibiotic covered with bandaid daily. She denies any drainage, pus, pain, swelling, redness. Patient denies fevers, chills, nausea, vomiting. Denies any calf pain, chest pain, SOB.   Objective:  Vitals: Reviewed  General: Well developed, nourished, in no acute distress, alert and oriented x3   Dermatology: Skin is warm, dry and supple bilateral. Medial hallux nail border appears to be clean, dry, with mild granular tissue and surrounding scab. There is no surrounding erythema, edema, drainage/purulence. The remaining nails appear unremarkable at this time. There are no other lesions or other signs of infection present.  Neurovascular status: Intact. No lower extremity swelling; No pain with calf compression bilateral.  Musculoskeletal: Decreased tenderness to palpation of the medial hallux nail fold. Muscular strength within normal limits bilateral.   Assesement and Plan: S/p partial nail avulsion, doing well.   -Continue soaking in epsom salts twice a day followed by antibiotic ointment and a band-aid. Can leave uncovered at night. Continue this until completely healed.  -If the area has not healed in 2 weeks, call the office for follow-up appointment, or sooner if any problems arise.  -Monitor for any signs/symptoms of infection. Call the office immediately if any occur or go directly to the emergency room. Call with any questions/concerns.  Celesta Gentile, DPM

## 2017-08-26 DIAGNOSIS — Z3482 Encounter for supervision of other normal pregnancy, second trimester: Secondary | ICD-10-CM | POA: Diagnosis not present

## 2017-08-26 DIAGNOSIS — Z23 Encounter for immunization: Secondary | ICD-10-CM | POA: Diagnosis not present

## 2017-09-17 DIAGNOSIS — Z3482 Encounter for supervision of other normal pregnancy, second trimester: Secondary | ICD-10-CM | POA: Diagnosis not present

## 2017-09-17 DIAGNOSIS — O479 False labor, unspecified: Secondary | ICD-10-CM | POA: Diagnosis not present

## 2017-09-19 LAB — OB RESULTS CONSOLE GBS: STREP GROUP B AG: POSITIVE

## 2017-09-19 MED FILL — AMOX-CLAV 500-125 MG TABLET: 500-125 | 7 days supply | Qty: 14 | Fill #0

## 2017-09-23 DIAGNOSIS — Z3483 Encounter for supervision of other normal pregnancy, third trimester: Secondary | ICD-10-CM | POA: Diagnosis not present

## 2017-09-23 DIAGNOSIS — Z3480 Encounter for supervision of other normal pregnancy, unspecified trimester: Secondary | ICD-10-CM | POA: Diagnosis not present

## 2017-10-18 DIAGNOSIS — Z23 Encounter for immunization: Secondary | ICD-10-CM | POA: Diagnosis not present

## 2017-10-18 DIAGNOSIS — Z3483 Encounter for supervision of other normal pregnancy, third trimester: Secondary | ICD-10-CM | POA: Diagnosis not present

## 2017-11-26 DIAGNOSIS — O320XX0 Maternal care for unstable lie, not applicable or unspecified: Secondary | ICD-10-CM | POA: Diagnosis not present

## 2017-11-26 DIAGNOSIS — Z3483 Encounter for supervision of other normal pregnancy, third trimester: Secondary | ICD-10-CM | POA: Diagnosis not present

## 2017-12-02 DIAGNOSIS — Z3483 Encounter for supervision of other normal pregnancy, third trimester: Secondary | ICD-10-CM | POA: Diagnosis not present

## 2017-12-23 ENCOUNTER — Telehealth (HOSPITAL_COMMUNITY): Payer: Self-pay | Admitting: *Deleted

## 2017-12-23 ENCOUNTER — Encounter (HOSPITAL_COMMUNITY): Payer: Self-pay | Admitting: *Deleted

## 2017-12-23 NOTE — Telephone Encounter (Signed)
Preadmission screen  

## 2017-12-25 ENCOUNTER — Telehealth (HOSPITAL_COMMUNITY): Payer: Self-pay | Admitting: *Deleted

## 2017-12-25 ENCOUNTER — Encounter (HOSPITAL_COMMUNITY): Payer: Self-pay | Admitting: *Deleted

## 2017-12-25 NOTE — Telephone Encounter (Signed)
Preadmission screen  

## 2017-12-30 DIAGNOSIS — O480 Post-term pregnancy: Secondary | ICD-10-CM | POA: Diagnosis not present

## 2018-01-01 ENCOUNTER — Other Ambulatory Visit: Payer: Self-pay

## 2018-01-01 ENCOUNTER — Inpatient Hospital Stay (HOSPITAL_COMMUNITY)
Admission: AD | Admit: 2018-01-01 | Discharge: 2018-01-05 | DRG: 788 | Disposition: A | Discharge status: Home / Self Care | Payer: 59 | Source: Ambulatory Visit | Attending: Obstetrics and Gynecology | Admitting: Obstetrics and Gynecology

## 2018-01-01 ENCOUNTER — Encounter (HOSPITAL_COMMUNITY): Payer: Self-pay | Admitting: *Deleted

## 2018-01-01 ENCOUNTER — Inpatient Hospital Stay (HOSPITAL_COMMUNITY): Payer: 59 | Admitting: Anesthesiology

## 2018-01-01 DIAGNOSIS — D573 Sickle-cell trait: Secondary | ICD-10-CM | POA: Diagnosis present

## 2018-01-01 DIAGNOSIS — O99824 Streptococcus B carrier state complicating childbirth: Secondary | ICD-10-CM | POA: Diagnosis present

## 2018-01-01 DIAGNOSIS — Z3A41 41 weeks gestation of pregnancy: Secondary | ICD-10-CM

## 2018-01-01 DIAGNOSIS — O9902 Anemia complicating childbirth: Secondary | ICD-10-CM | POA: Diagnosis present

## 2018-01-01 DIAGNOSIS — Z3483 Encounter for supervision of other normal pregnancy, third trimester: Secondary | ICD-10-CM | POA: Diagnosis present

## 2018-01-01 DIAGNOSIS — Z98891 History of uterine scar from previous surgery: Secondary | ICD-10-CM

## 2018-01-01 DIAGNOSIS — O34211 Maternal care for low transverse scar from previous cesarean delivery: Principal | ICD-10-CM | POA: Diagnosis present

## 2018-01-01 LAB — TYPE AND SCREEN
ABO/RH(D): O POS
Antibody Screen: NEGATIVE

## 2018-01-01 LAB — CBC
HCT: 33.6 % — ABNORMAL LOW (ref 36.0–46.0)
Hemoglobin: 11.1 g/dL — ABNORMAL LOW (ref 12.0–15.0)
MCH: 27.5 pg (ref 26.0–34.0)
MCHC: 33 g/dL (ref 30.0–36.0)
MCV: 83.2 fL (ref 78.0–100.0)
Platelets: 277 10*3/uL (ref 150–400)
RBC: 4.04 MIL/uL (ref 3.87–5.11)
RDW: 14.2 % (ref 11.5–15.5)
WBC: 11.1 10*3/uL — ABNORMAL HIGH (ref 4.0–10.5)

## 2018-01-01 LAB — RPR: RPR Ser Ql: NONREACTIVE

## 2018-01-01 MED ORDER — ONDANSETRON HCL 4 MG/2ML IJ SOLN: 4.0000 mg | Freq: Four times a day (QID) | INTRAMUSCULAR | Status: DC | PRN

## 2018-01-01 MED ORDER — OXYCODONE-ACETAMINOPHEN 5-325 MG PO TABS: 1.0000 | ORAL_TABLET | ORAL | Status: DC | PRN

## 2018-01-01 MED ORDER — FENTANYL CITRATE (PF) 100 MCG/2ML IJ SOLN
50.0000 ug | INTRAMUSCULAR | Status: DC | PRN
  Administered 2018-01-01: 50 ug via INTRAVENOUS
  Filled 2018-01-01: qty 2

## 2018-01-01 MED ORDER — LACTATED RINGERS IV SOLN
500.0000 mL | INTRAVENOUS | Status: DC | PRN
  Administered 2018-01-01 – 2018-01-02 (×2): 250 mL via INTRAVENOUS

## 2018-01-01 MED ORDER — ACETAMINOPHEN 325 MG PO TABS: 650.0000 mg | ORAL_TABLET | ORAL | Status: DC | PRN

## 2018-01-01 MED ORDER — OXYCODONE-ACETAMINOPHEN 5-325 MG PO TABS: 2.0000 | ORAL_TABLET | ORAL | Status: DC | PRN

## 2018-01-01 MED ORDER — EPHEDRINE 5 MG/ML INJ
10.0000 mg | INTRAVENOUS | Status: AC | PRN
  Administered 2018-01-01 (×2): 10 mg via INTRAVENOUS

## 2018-01-01 MED ORDER — SODIUM CHLORIDE 0.9 % IV SOLN
5.0000 10*6.[IU] | Freq: Once | INTRAVENOUS | Status: AC
  Administered 2018-01-01: 5 10*6.[IU] via INTRAVENOUS
  Filled 2018-01-01: qty 5

## 2018-01-01 MED ORDER — LACTATED RINGERS IV SOLN: 500.0000 mL | Freq: Once | INTRAVENOUS | Status: DC

## 2018-01-01 MED ORDER — PENICILLIN G POT IN DEXTROSE 60000 UNIT/ML IV SOLN
3.0000 10*6.[IU] | INTRAVENOUS | Status: DC
  Administered 2018-01-01 – 2018-01-02 (×4): 3 10*6.[IU] via INTRAVENOUS
  Filled 2018-01-01 (×7): qty 50

## 2018-01-01 MED ORDER — FENTANYL 2.5 MCG/ML BUPIVACAINE 1/10 % EPIDURAL INFUSION (WH - ANES)
14.0000 mL/h | INTRAMUSCULAR | Status: DC | PRN
  Administered 2018-01-01 (×2): 14 mL/h via EPIDURAL
  Filled 2018-01-01 (×2): qty 100

## 2018-01-01 MED ORDER — PHENYLEPHRINE 40 MCG/ML (10ML) SYRINGE FOR IV PUSH (FOR BLOOD PRESSURE SUPPORT)
80.0000 ug | PREFILLED_SYRINGE | INTRAVENOUS | Status: DC | PRN
  Administered 2018-01-01 (×2): 80 ug via INTRAVENOUS

## 2018-01-01 MED ORDER — PHENYLEPHRINE 40 MCG/ML (10ML) SYRINGE FOR IV PUSH (FOR BLOOD PRESSURE SUPPORT)
80.0000 ug | PREFILLED_SYRINGE | INTRAVENOUS | Status: DC | PRN
  Filled 2018-01-01 (×2): qty 10

## 2018-01-01 MED ORDER — OXYTOCIN 40 UNITS IN LACTATED RINGERS INFUSION - SIMPLE MED: 2.5000 [IU]/h | INTRAVENOUS | Status: DC

## 2018-01-01 MED ORDER — TERBUTALINE SULFATE 1 MG/ML IJ SOLN: 0.2500 mg | Freq: Once | INTRAMUSCULAR | Status: DC | PRN

## 2018-01-01 MED ORDER — OXYTOCIN BOLUS FROM INFUSION: 500.0000 mL | Freq: Once | INTRAVENOUS | Status: DC

## 2018-01-01 MED ORDER — OXYTOCIN 40 UNITS IN LACTATED RINGERS INFUSION - SIMPLE MED
1.0000 m[IU]/min | INTRAVENOUS | Status: DC
  Administered 2018-01-01: 2 m[IU]/min via INTRAVENOUS
  Filled 2018-01-01: qty 1000

## 2018-01-01 MED ORDER — EPHEDRINE 5 MG/ML INJ
10.0000 mg | INTRAVENOUS | Status: DC | PRN
  Filled 2018-01-01: qty 4

## 2018-01-01 MED ORDER — LACTATED RINGERS IV SOLN
INTRAVENOUS | Status: DC
  Administered 2018-01-01 (×4): via INTRAVENOUS

## 2018-01-01 MED ORDER — EPHEDRINE 5 MG/ML INJ: 10.0000 mg | INTRAVENOUS | Status: DC | PRN

## 2018-01-01 MED ORDER — SOD CITRATE-CITRIC ACID 500-334 MG/5ML PO SOLN
30.0000 mL | ORAL | Status: DC | PRN
  Filled 2018-01-01: qty 15

## 2018-01-01 MED ORDER — PHENYLEPHRINE 40 MCG/ML (10ML) SYRINGE FOR IV PUSH (FOR BLOOD PRESSURE SUPPORT): 80.0000 ug | PREFILLED_SYRINGE | INTRAVENOUS | Status: DC | PRN

## 2018-01-01 MED ORDER — DIPHENHYDRAMINE HCL 50 MG/ML IJ SOLN: 12.5000 mg | INTRAMUSCULAR | Status: DC | PRN

## 2018-01-01 MED ORDER — LIDOCAINE HCL (PF) 1 % IJ SOLN
30.0000 mL | INTRAMUSCULAR | Status: DC | PRN
  Filled 2018-01-01: qty 30

## 2018-01-01 MED ORDER — LIDOCAINE HCL (PF) 1 % IJ SOLN
INTRAMUSCULAR | Status: DC | PRN
  Administered 2018-01-01: 6 mL via EPIDURAL

## 2018-01-01 NOTE — Anesthesia Pain Management Evaluation Note (Signed)
  CRNA Pain Management Visit Note  Patient: Carolyn Bush, 37 y.o., female  "Hello I am a member of the anesthesia team at [facility]. We have an anesthesia team available at all times to provide care throughout the hospital, including epidural management and anesthesia for C-section. I don't know your plan for the delivery whether it a natural birth, water birth, IV sedation, nitrous supplementation, doula or epidural, but we want to meet your pain goals."   1.Was your pain managed to your expectations on prior hospitalizations?   Yes   2.What is your expectation for pain management during this hospitalization?     Epidural  3.How can we help you reach that goal?   Record the patient's initial score and the patient's pain goal.   Pain: 0  Pain Goal: 3 [facility] wants you to be able to say your pain was always managed very well.  Jabier Mutton 01/01/2018

## 2018-01-01 NOTE — Progress Notes (Signed)
  Subjective: Comfortable with epidural .   Objective: BP (!) 112/55   Pulse (!) 101   Temp 98 F (36.7 C) (Oral)   Resp 20   Ht 5\' 7"  (1.702 m)   Wt 78 kg (172 lb)   LMP 03/18/2017   SpO2 99%   BMI 26.94 kg/m  No intake/output data recorded. Total I/O In: -  Out: 150 [Urine:150]  FHT:  FHR: 120 bpm, variability: moderate,  accelerations:  Present,  decelerations:  Absent UC:   regular, every 5 minutes SVE:   Dilation: 5 Effacement (%): 80 Station: 0 Exam by:: Dr. Landry Mellow  Labs: Lab Results  Component Value Date   WBC 11.1 (H) 01/01/2018   HGB 11.1 (L) 01/01/2018   HCT 33.6 (L) 01/01/2018   MCV 83.2 01/01/2018   PLT 277 01/01/2018    Assessment / Plan: Spontaneous labor, progressing normally  Labor: Progressing normally Preeclampsia:  NA Fetal Wellbeing:  Category I Pain Control:  Epidural I/D:  Pencillin  Anticipated MOD:  NSVD  Carolyn Bush J. 01/01/2018, 1:29 PM

## 2018-01-01 NOTE — Progress Notes (Signed)
  Subjective: Patient comfortable with epidural   Objective: BP (!) 89/38 Comment: pt denies dizziness, nausea, light-headed  Pulse (!) 102   Temp 97.9 F (36.6 C) (Axillary)   Resp 20   Ht 5\' 7"  (1.702 m)   Wt 78 kg (172 lb)   LMP 03/18/2017   SpO2 99%   BMI 26.94 kg/m  No intake/output data recorded. Total I/O In: -  Out: 150 [Urine:150]  FHT:  FHR: 130 bpm, variability: moderate,  accelerations:  Present,  decelerations:  Present variable /early UC:   regular, every 2-3 minutes SVE:   6/90/-2  Labs: Lab Results  Component Value Date   WBC 11.1 (H) 01/01/2018   HGB 11.1 (L) 01/01/2018   HCT 33.6 (L) 01/01/2018   MCV 83.2 01/01/2018   PLT 277 01/01/2018    Assessment / Plan: Protracted active phase  Labor: start pitocin 2x2 Preeclampsia:  NA Fetal Wellbeing:  Category I Pain Control:  Epidural I/D:  penicillin  Anticipated MOD:  NSVD  Dr,. Varnado covering afte 5 pm   Carina Chaplin J. 01/01/2018, 4:21 PM

## 2018-01-01 NOTE — H&P (Signed)
Carolyn Bush is a 37 y.o. female presenting  With regular contractions every 5 minutes. She ws 3 cm in MAU upon arrival +FM . No lof no vaginal bleeding. Pregnancy is complicated by h/o cesarean section. Pt desries a trial of labor. Prenatal care provided by Dr. Christophe Louis with [facility].   OB History    Gravida Para Term Preterm AB Living   2 1 1  0 0 1   SAB TAB Ectopic Multiple Live Births   0 0 0 0 1     Past Medical History:  Diagnosis Date  . Asthma   . History of chicken pox   . Sickle cell trait [facility])    Past Surgical History:  Procedure Laterality Date  . BUNIONECTOMY    . CESAREAN SECTION  02/20/2012   Procedure: CESAREAN SECTION;  Surgeon: Maeola Sarah. Landry Mellow, MD;  Location: [facility];  Service: Gynecology;  Laterality: N/A;   Family History: family history includes Birth defects in her brother; Cancer in her mother; Diabetes in her maternal grandfather; Prostate cancer in her paternal grandfather; Sarcoidosis in her maternal grandfather; Seizures in her brother; Thyroid disease in her maternal grandmother. Social History:  reports that  has never smoked. she has never used smokeless tobacco. She reports that she does not drink alcohol or use drugs.     Maternal Diabetes: No Genetic Screening: Declined Maternal Ultrasounds/Referrals: Normal Fetal Ultrasounds or other Referrals:  None Maternal Substance Abuse:  No Significant Maternal Medications:  None Significant Maternal Lab Results:  Lab values include: Group B Strep positive Other Comments:  None  Review of Systems  Constitutional: Negative.   HENT: Negative.   Eyes: Negative.   Respiratory: Negative.   Cardiovascular: Negative.   Gastrointestinal: Negative.   Genitourinary: Negative.   Musculoskeletal: Negative.   Skin: Negative.   Neurological: Negative.   Endo/Heme/Allergies: Negative.   Psychiatric/Behavioral: Negative.    History Dilation: 3.5 Effacement (%): 70 Station: -2 Exam by:: Dr. Landry Mellow Blood  pressure (!) 104/57, pulse (!) 108, temperature 98.1 F (36.7 C), temperature source Oral, resp. rate 18, height 5\' 7"  (1.702 m), weight 78 kg (172 lb), last menstrual period 03/18/2017, SpO2 100 %, unknown if currently breastfeeding. Maternal Exam:  Uterine Assessment: Contraction strength is firm.  Contraction frequency is regular.   Abdomen: Patient reports no abdominal tenderness. Surgical scars: low transverse.   Estimated fetal weight is 9 lbs +/- 13 oz on ultrasound 12/30/2017.   Fetal presentation: vertex  Introitus: Normal vulva. Normal vagina.    Fetal Exam Fetal Monitor Review: Baseline rate: 120.  Variability: moderate (6-25 bpm).   Pattern: accelerations present and no decelerations.    Fetal State Assessment: Category I - tracings are normal.     Physical Exam  Vitals reviewed. Constitutional: She is oriented to person, place, and time. She appears well-developed and well-nourished.  HENT:  Head: Normocephalic and atraumatic.  Eyes: Conjunctivae are normal. Pupils are equal, round, and reactive to light.  Cardiovascular: Normal rate and regular rhythm.  Respiratory: Effort normal and breath sounds normal.  GI: There is no tenderness.  Genitourinary: Vagina normal.  Musculoskeletal: Normal range of motion. She exhibits no edema.  Neurological: She is alert and oriented to person, place, and time. She has normal reflexes.  Skin: Skin is warm and dry.  Psychiatric: She has a normal mood and affect.   cervical exam 3.5/ 75/-2 intact   Prenatal labs: ABO, Rh: --/--/O POS (02/27 0801) Antibody: NEG (02/27 0801) Rubella: Immune (07/13  0000) RPR: Nonreactive (07/13 0000)  HBsAg: Negative (07/13 0000)  HIV: Non-reactive (07/13 0000)  GBS:   Positive in urine   Assessment/Plan: 41 wks and 2 days in active labor - H/o cesarean section. R/B/A inlcuding of uterine rupture with increase fetal and maternal morbidity and mortality discussed.  Pt accepts risk and desires  TOL GBS positive - Penicilln  Category 1 tracing.  Anticipate SVD  Charon Akamine J. 01/01/2018, 9:50 AM

## 2018-01-01 NOTE — Anesthesia Procedure Notes (Signed)
Epidural Patient location during procedure: OB Start time: 01/01/2018 11:06 AM End time: 01/01/2018 11:15 AM  Staffing Anesthesiologist: Barnet Glasgow, MD Performed: anesthesiologist   Preanesthetic Checklist Completed: patient identified, site marked, surgical consent, pre-op evaluation, timeout performed, IV checked, risks and benefits discussed and monitors and equipment checked  Epidural Patient position: sitting Prep: site prepped and draped and DuraPrep Patient monitoring: continuous pulse ox and blood pressure Approach: midline Location: L3-L4 Injection technique: LOR air  Needle:  Needle type: Tuohy  Needle gauge: 17 G Needle length: 9 cm and 9 Needle insertion depth: 4 cm Catheter type: closed end flexible Catheter size: 19 Gauge Catheter at skin depth: 9 cm Test dose: negative  Assessment Events: blood not aspirated, injection not painful, no injection resistance, negative IV test and no paresthesia

## 2018-01-01 NOTE — MAU Note (Signed)
0545- per CNM Clemmons  Okay for pt to be off monitor for an hour and walk. Will get NST upon her return and check her cervix.  Per Dr Nelda Marseille, Dr Landry Mellow will come check her cervix. Patient in room with doula and husband.

## 2018-01-01 NOTE — Anesthesia Preprocedure Evaluation (Signed)
Anesthesia Evaluation  Patient identified by MRN, date of birth, ID band Patient awake    Reviewed: Allergy & Precautions, Patient's Chart, lab work & pertinent test results  Airway Mallampati: II  TM Distance: >3 FB Neck ROM: Full    Dental no notable dental hx.    Pulmonary asthma ,    Pulmonary exam normal breath sounds clear to auscultation       Cardiovascular negative cardio ROS Normal cardiovascular exam Rhythm:Regular Rate:Normal     Neuro/Psych    GI/Hepatic   Endo/Other    Renal/GU      Musculoskeletal   Abdominal   Peds  Hematology   Anesthesia Other Findings   Reproductive/Obstetrics (+) Pregnancy                             Lab Results  Component Value Date   WBC 11.1 (H) 01/01/2018   HGB 11.1 (L) 01/01/2018   HCT 33.6 (L) 01/01/2018   MCV 83.2 01/01/2018   PLT 277 01/01/2018    Anesthesia Physical Anesthesia Plan  ASA: II  Anesthesia Plan: Epidural   Post-op Pain Management:    Induction:   PONV Risk Score and Plan:   Airway Management Planned:   Additional Equipment:   Intra-op Plan:   Post-operative Plan:   Informed Consent: I have reviewed the patients History and Physical, chart, labs and discussed the procedure including the risks, benefits and alternatives for the proposed anesthesia with the patient or authorized representative who has indicated his/her understanding and acceptance.     Plan Discussed with:   Anesthesia Plan Comments:         Anesthesia Quick Evaluation

## 2018-01-01 NOTE — MAU Note (Signed)
Pt reporting strong cntx 4-5 mins apart since 2300. Denies LOF or bleeding. + Fm.

## 2018-01-01 NOTE — Progress Notes (Signed)
Carolyn Bush is a 37 y.o. G2P1001 at [redacted]w[redacted]d   Subjective: Pt comfortable.  Feels legs are too numb.  Objective: BP 110/68   Pulse 100   Temp 98.5 F (36.9 C) (Oral)   Resp 20   Ht 5\' 7"  (1.702 m)   Wt 78 kg (172 lb)   LMP 03/18/2017   SpO2 99%   BMI 26.94 kg/m  No intake/output data recorded. Total I/O In: -  Out: 150 [Urine:150]  Abd:  Abdominal wall laxity. FHT:  FHR: 120-130s bpm, variability: moderate,  accelerations:  Present,  decelerations:  Absent UC:   regular, every 2-3 minutes SVE:   Dilation: 6.5 Effacement (%): 90 Station: 0 Exam by:: Dr. Simona Huh FSE placed with pt's consent due to intermittent fetal tracing. IUPC placed due to minimal cervical change. Both placed without difficulty.  Labs: Lab Results  Component Value Date   WBC 11.1 (H) 01/01/2018   HGB 11.1 (L) 01/01/2018   HCT 33.6 (L) 01/01/2018   MCV 83.2 01/01/2018   PLT 277 01/01/2018    Assessment / Plan: IUP @ 41 2/7 weeks  TOLAC.  Labor: Minimal change.  Adjust Pitocin prn per MVUs.  No s/sxs of uterine rupture.  Place binder to hold baby in position. Preeclampsia:  BP normal Fetal Wellbeing:  Category I Pain Control:  Epidural I/D:  GBS+ on PCN. Anticipated MOD:  NSVD  Thurnell Lose 01/01/2018, 6:44 PM

## 2018-01-02 ENCOUNTER — Encounter (HOSPITAL_COMMUNITY): Payer: Self-pay

## 2018-01-02 ENCOUNTER — Encounter (HOSPITAL_COMMUNITY)
Admission: AD | Disposition: A | Discharge status: Home / Self Care | Payer: Self-pay | Source: Ambulatory Visit | Attending: Obstetrics and Gynecology

## 2018-01-02 LAB — CBC
HCT: 30.3 % — ABNORMAL LOW (ref 36.0–46.0)
Hemoglobin: 10.3 g/dL — ABNORMAL LOW (ref 12.0–15.0)
MCH: 27.9 pg (ref 26.0–34.0)
MCHC: 34 g/dL (ref 30.0–36.0)
MCV: 82.1 fL (ref 78.0–100.0)
PLATELETS: 240 10*3/uL (ref 150–400)
RBC: 3.69 MIL/uL — ABNORMAL LOW (ref 3.87–5.11)
RDW: 14.3 % (ref 11.5–15.5)
WBC: 21.7 10*3/uL — ABNORMAL HIGH (ref 4.0–10.5)

## 2018-01-02 SURGERY — Surgical Case
Anesthesia: Epidural

## 2018-01-02 MED ORDER — IBUPROFEN 600 MG PO TABS
600.0000 mg | ORAL_TABLET | Freq: Four times a day (QID) | ORAL | Status: DC
  Administered 2018-01-02 – 2018-01-05 (×13): 600 mg via ORAL
  Filled 2018-01-02 (×13): qty 1

## 2018-01-02 MED ORDER — DIPHENHYDRAMINE HCL 25 MG PO CAPS
25.0000 mg | ORAL_CAPSULE | Freq: Four times a day (QID) | ORAL | Status: DC | PRN
  Administered 2018-01-02: 25 mg via ORAL
  Filled 2018-01-02: qty 1

## 2018-01-02 MED ORDER — LIDOCAINE-EPINEPHRINE (PF) 2 %-1:200000 IJ SOLN
INTRAMUSCULAR | Status: AC
  Filled 2018-01-02: qty 20

## 2018-01-02 MED ORDER — MEPERIDINE HCL 25 MG/ML IJ SOLN: 6.2500 mg | INTRAMUSCULAR | Status: DC | PRN

## 2018-01-02 MED ORDER — DIBUCAINE 1 % RE OINT: 1.0000 "application " | TOPICAL_OINTMENT | RECTAL | Status: DC | PRN

## 2018-01-02 MED ORDER — ACETAMINOPHEN 325 MG PO TABS
650.0000 mg | ORAL_TABLET | ORAL | Status: DC | PRN
  Administered 2018-01-04 – 2018-01-05 (×5): 650 mg via ORAL
  Filled 2018-01-02 (×4): qty 2

## 2018-01-02 MED ORDER — MENTHOL 3 MG MT LOZG: 1.0000 | LOZENGE | OROMUCOSAL | Status: DC | PRN

## 2018-01-02 MED ORDER — ACETAMINOPHEN 500 MG PO TABS
1000.0000 mg | ORAL_TABLET | Freq: Four times a day (QID) | ORAL | Status: AC
  Administered 2018-01-02 (×3): 1000 mg via ORAL
  Filled 2018-01-02 (×3): qty 2

## 2018-01-02 MED ORDER — KETOROLAC TROMETHAMINE 30 MG/ML IJ SOLN
INTRAMUSCULAR | Status: AC
  Administered 2018-01-02: 30 mg via INTRAMUSCULAR
  Filled 2018-01-02: qty 1

## 2018-01-02 MED ORDER — MORPHINE SULFATE (PF) 0.5 MG/ML IJ SOLN
INTRAMUSCULAR | Status: DC | PRN
  Administered 2018-01-02: 4 mg via EPIDURAL

## 2018-01-02 MED ORDER — DEXAMETHASONE SODIUM PHOSPHATE 4 MG/ML IJ SOLN
INTRAMUSCULAR | Status: DC | PRN
  Administered 2018-01-02: 4 mg via INTRAVENOUS

## 2018-01-02 MED ORDER — LACTATED RINGERS IV SOLN
INTRAVENOUS | Status: DC | PRN
  Administered 2018-01-02 (×2): via INTRAVENOUS

## 2018-01-02 MED ORDER — SIMETHICONE 80 MG PO CHEW
80.0000 mg | CHEWABLE_TABLET | ORAL | Status: DC
  Administered 2018-01-02 – 2018-01-04 (×3): 80 mg via ORAL
  Filled 2018-01-02 (×3): qty 1

## 2018-01-02 MED ORDER — KETOROLAC TROMETHAMINE 30 MG/ML IJ SOLN: 30.0000 mg | Freq: Four times a day (QID) | INTRAMUSCULAR | Status: AC | PRN

## 2018-01-02 MED ORDER — SODIUM BICARBONATE 8.4 % IV SOLN
INTRAVENOUS | Status: DC | PRN
  Administered 2018-01-02 (×2): 5 mL via EPIDURAL

## 2018-01-02 MED ORDER — NALOXONE HCL 0.4 MG/ML IJ SOLN: 0.4000 mg | INTRAMUSCULAR | Status: DC | PRN

## 2018-01-02 MED ORDER — SCOPOLAMINE 1 MG/3DAYS TD PT72
MEDICATED_PATCH | TRANSDERMAL | Status: DC | PRN
  Administered 2018-01-02: 1 via TRANSDERMAL

## 2018-01-02 MED ORDER — OXYCODONE-ACETAMINOPHEN 5-325 MG PO TABS: 1.0000 | ORAL_TABLET | ORAL | Status: DC | PRN

## 2018-01-02 MED ORDER — SCOPOLAMINE 1 MG/3DAYS TD PT72
MEDICATED_PATCH | TRANSDERMAL | Status: AC
  Filled 2018-01-02: qty 1

## 2018-01-02 MED ORDER — DEXAMETHASONE SODIUM PHOSPHATE 4 MG/ML IJ SOLN
INTRAMUSCULAR | Status: AC
  Filled 2018-01-02: qty 1

## 2018-01-02 MED ORDER — ONDANSETRON HCL 4 MG/2ML IJ SOLN
INTRAMUSCULAR | Status: AC
  Filled 2018-01-02: qty 4

## 2018-01-02 MED ORDER — FERROUS SULFATE 325 (65 FE) MG PO TABS
325.0000 mg | ORAL_TABLET | Freq: Two times a day (BID) | ORAL | Status: DC
  Administered 2018-01-02 – 2018-01-05 (×6): 325 mg via ORAL
  Filled 2018-01-02 (×6): qty 1

## 2018-01-02 MED ORDER — OXYCODONE-ACETAMINOPHEN 5-325 MG PO TABS: 2.0000 | ORAL_TABLET | ORAL | Status: DC | PRN

## 2018-01-02 MED ORDER — MORPHINE SULFATE (PF) 0.5 MG/ML IJ SOLN
INTRAMUSCULAR | Status: AC
  Filled 2018-01-02: qty 10

## 2018-01-02 MED ORDER — METHYLERGONOVINE MALEATE 0.2 MG PO TABS: 0.2000 mg | ORAL_TABLET | ORAL | Status: DC | PRN

## 2018-01-02 MED ORDER — SIMETHICONE 80 MG PO CHEW
80.0000 mg | CHEWABLE_TABLET | ORAL | Status: DC | PRN
Start: 2018-01-02 — End: 2018-01-05

## 2018-01-02 MED ORDER — SODIUM CHLORIDE 0.9 % IR SOLN
Status: DC | PRN
  Administered 2018-01-02: 1000 mL

## 2018-01-02 MED ORDER — SODIUM CHLORIDE 0.9% FLUSH: 3.0000 mL | INTRAVENOUS | Status: DC | PRN

## 2018-01-02 MED ORDER — LACTATED RINGERS IV SOLN: INTRAVENOUS | Status: DC

## 2018-01-02 MED ORDER — FENTANYL CITRATE (PF) 100 MCG/2ML IJ SOLN: 25.0000 ug | INTRAMUSCULAR | Status: DC | PRN

## 2018-01-02 MED ORDER — ONDANSETRON HCL 4 MG/2ML IJ SOLN
INTRAMUSCULAR | Status: DC | PRN
  Administered 2018-01-02: 4 mg via INTRAVENOUS

## 2018-01-02 MED ORDER — METHYLERGONOVINE MALEATE 0.2 MG/ML IJ SOLN: 0.2000 mg | INTRAMUSCULAR | Status: DC | PRN

## 2018-01-02 MED ORDER — LACTATED RINGERS IV SOLN
INTRAVENOUS | Status: DC | PRN
  Administered 2018-01-02: 40 [IU] via INTRAVENOUS

## 2018-01-02 MED ORDER — PRENATAL MULTIVITAMIN CH
1.0000 | ORAL_TABLET | Freq: Every day | ORAL | Status: DC
  Administered 2018-01-02 – 2018-01-04 (×3): 1 via ORAL
  Filled 2018-01-02 (×3): qty 1

## 2018-01-02 MED ORDER — LACTATED RINGERS IV SOLN
INTRAVENOUS | Status: DC
  Administered 2018-01-02: 15:00:00 via INTRAVENOUS

## 2018-01-02 MED ORDER — CEFAZOLIN SODIUM-DEXTROSE 2-4 GM/100ML-% IV SOLN
INTRAVENOUS | Status: AC
  Filled 2018-01-02: qty 100

## 2018-01-02 MED ORDER — SENNOSIDES-DOCUSATE SODIUM 8.6-50 MG PO TABS
2.0000 | ORAL_TABLET | ORAL | Status: DC
  Administered 2018-01-02 – 2018-01-04 (×3): 2 via ORAL
  Filled 2018-01-02 (×3): qty 2

## 2018-01-02 MED ORDER — ONDANSETRON HCL 4 MG/2ML IJ SOLN: 4.0000 mg | Freq: Three times a day (TID) | INTRAMUSCULAR | Status: DC | PRN

## 2018-01-02 MED ORDER — OXYTOCIN 10 UNIT/ML IJ SOLN
INTRAMUSCULAR | Status: AC
  Filled 2018-01-02: qty 4

## 2018-01-02 MED ORDER — CEFAZOLIN SODIUM-DEXTROSE 2-3 GM-%(50ML) IV SOLR
INTRAVENOUS | Status: DC | PRN
  Administered 2018-01-02: 2 g via INTRAVENOUS

## 2018-01-02 MED ORDER — METOCLOPRAMIDE HCL 5 MG/ML IJ SOLN: 10.0000 mg | Freq: Once | INTRAMUSCULAR | Status: DC | PRN

## 2018-01-02 MED ORDER — SODIUM BICARBONATE 8.4 % IV SOLN
INTRAVENOUS | Status: AC
  Filled 2018-01-02: qty 50

## 2018-01-02 MED ORDER — TETANUS-DIPHTH-ACELL PERTUSSIS 5-2.5-18.5 LF-MCG/0.5 IM SUSP: 0.5000 mL | Freq: Once | INTRAMUSCULAR | Status: DC

## 2018-01-02 MED ORDER — KETOROLAC TROMETHAMINE 30 MG/ML IJ SOLN
30.0000 mg | Freq: Four times a day (QID) | INTRAMUSCULAR | Status: AC | PRN
  Administered 2018-01-02: 30 mg via INTRAMUSCULAR

## 2018-01-02 MED ORDER — ZOLPIDEM TARTRATE 5 MG PO TABS: 5.0000 mg | ORAL_TABLET | Freq: Every evening | ORAL | Status: DC | PRN

## 2018-01-02 MED ORDER — OXYTOCIN 40 UNITS IN LACTATED RINGERS INFUSION - SIMPLE MED: 2.5000 [IU]/h | INTRAVENOUS | Status: AC

## 2018-01-02 MED ORDER — WITCH HAZEL-GLYCERIN EX PADS: 1.0000 "application " | MEDICATED_PAD | CUTANEOUS | Status: DC | PRN

## 2018-01-02 MED ORDER — COCONUT OIL OIL
1.0000 "application " | TOPICAL_OIL | Status: DC | PRN
  Filled 2018-01-02: qty 120

## 2018-01-02 MED ORDER — SIMETHICONE 80 MG PO CHEW
80.0000 mg | CHEWABLE_TABLET | Freq: Three times a day (TID) | ORAL | Status: DC
  Administered 2018-01-02 – 2018-01-05 (×7): 80 mg via ORAL
  Filled 2018-01-02 (×8): qty 1

## 2018-01-02 SURGICAL SUPPLY — 38 items
BARRIER ADHS 3X4 INTERCEED (GAUZE/BANDAGES/DRESSINGS) ×3 IMPLANT
BENZOIN TINCTURE PRP APPL 2/3 (GAUZE/BANDAGES/DRESSINGS) ×3 IMPLANT
CHLORAPREP W/TINT 26ML (MISCELLANEOUS) ×3 IMPLANT
CLAMP CORD UMBIL (MISCELLANEOUS) ×3 IMPLANT
CLOSURE WOUND 1/2 X4 (GAUZE/BANDAGES/DRESSINGS) ×1
CLOTH BEACON ORANGE TIMEOUT ST (SAFETY) ×3 IMPLANT
DERMABOND ADVANCED (GAUZE/BANDAGES/DRESSINGS)
DERMABOND ADVANCED .7 DNX12 (GAUZE/BANDAGES/DRESSINGS) IMPLANT
DRSG OPSITE POSTOP 4X10 (GAUZE/BANDAGES/DRESSINGS) ×3 IMPLANT
ELECT REM PT RETURN 9FT ADLT (ELECTROSURGICAL) ×3
ELECTRODE REM PT RTRN 9FT ADLT (ELECTROSURGICAL) ×1 IMPLANT
EXTRACTOR VACUUM BELL STYLE (SUCTIONS) ×3 IMPLANT
GAUZE SPONGE 4X4 12PLY STRL (GAUZE/BANDAGES/DRESSINGS) ×6 IMPLANT
GLOVE BIO SURGEON STRL SZ7 (GLOVE) ×3 IMPLANT
GLOVE BIOGEL PI IND STRL 7.0 (GLOVE) ×2 IMPLANT
GLOVE BIOGEL PI INDICATOR 7.0 (GLOVE) ×4
GOWN STRL REUS W/TWL LRG LVL3 (GOWN DISPOSABLE) ×6 IMPLANT
KIT ABG SYR 3ML LUER SLIP (SYRINGE) ×3 IMPLANT
NEEDLE HYPO 25X5/8 SAFETYGLIDE (NEEDLE) ×3 IMPLANT
NS IRRIG 1000ML POUR BTL (IV SOLUTION) ×3 IMPLANT
PACK C SECTION WH (CUSTOM PROCEDURE TRAY) ×3 IMPLANT
PAD ABD 8X10 STRL (GAUZE/BANDAGES/DRESSINGS) ×3 IMPLANT
PAD OB MATERNITY 4.3X12.25 (PERSONAL CARE ITEMS) ×3 IMPLANT
PENCIL SMOKE EVAC W/HOLSTER (ELECTROSURGICAL) ×3 IMPLANT
RTRCTR C-SECT PINK 25CM LRG (MISCELLANEOUS) ×3 IMPLANT
STRIP CLOSURE SKIN 1/2X4 (GAUZE/BANDAGES/DRESSINGS) ×2 IMPLANT
SUT CHROMIC 0 CT 1 (SUTURE) ×3 IMPLANT
SUT CHROMIC 0 CTX 36 (SUTURE) IMPLANT
SUT MON AB 4-0 PS1 27 (SUTURE) ×3 IMPLANT
SUT PLAIN 0 NONE (SUTURE) IMPLANT
SUT PLAIN 2 0 XLH (SUTURE) ×3 IMPLANT
SUT VIC AB 0 CTX 36 (SUTURE) ×10
SUT VIC AB 0 CTX36XBRD ANBCTRL (SUTURE) ×5 IMPLANT
SUT VIC AB 2-0 CT1 27 (SUTURE) ×2
SUT VIC AB 2-0 CT1 TAPERPNT 27 (SUTURE) ×1 IMPLANT
TAPE CLOTH SURG 4X10 WHT LF (GAUZE/BANDAGES/DRESSINGS) ×3 IMPLANT
TOWEL OR 17X24 6PK STRL BLUE (TOWEL DISPOSABLE) ×3 IMPLANT
TRAY FOLEY BAG SILVER LF 14FR (SET/KITS/TRAYS/PACK) IMPLANT

## 2018-01-02 NOTE — Progress Notes (Signed)
Prolonged variable deceleration noted with pushing.  Variability good. O2 by face mask placed. Decelerations persisited.  Pt offered VAVD.  Consent obtained. Vacuum applied.  3 pop offs.  Vertex brought to introitus but would slide back up.  Pt consented on need for cesarean section due to NRFHTs and failed vacuum.  HR in 90s. Emergent c-section called.

## 2018-01-02 NOTE — Progress Notes (Signed)
In to assess pt. Cervix 9 cm.  Anterior cervix is less edematous.  Very reducible.  +2 station. Reassuring fetal status.  Contractions q 2 min.  Adequate MVUs. Abdomen nontender. Continue current management.  Start pushing once complete.   Anticipate SVD.

## 2018-01-02 NOTE — Consult Note (Signed)
Neonatology Note:   Attendance at C-section:    I was asked by Dr. Simona Huh to attend this repeat C/S at 41 3/7 weeks after attempted VBAC, FTP, failed vacuum extraction, and fetal bradycardia. The mother is a G2P1 O pos, GBS positive with sickle trait and previous C-section. ROM 14.5 hours prior to delivery, fluid clear. Treated with Pen G > 4 hours prior to delivery, remained afebrile throughout. Vacuum popped off X3, no progress being made, decision to perform C-section. Then, fetal HR dropped to the 80s and remained there for 5 minutes, Stat C-section called. Vacuum-assisted delivery at C-section. Infant vigorous with good spontaneous cry and tone. Delayed cord clamping was done. Needed no suctioning. Ap 9/9. Lungs clear to ausc in DR. Infant is able to remain with his mother for skin to skin time under nursing supervision. Transferred to the care of Pediatrician.   Real Cons, MD

## 2018-01-02 NOTE — Anesthesia Postprocedure Evaluation (Signed)
Anesthesia Post Note  Patient: Carolyn Bush  Procedure(s) Performed: CESAREAN SECTION (N/A )     Patient location during evaluation: Mother Baby Anesthesia Type: Epidural Level of consciousness: awake and alert and oriented Pain management: satisfactory to patient Vital Signs Assessment: post-procedure vital signs reviewed and stable Respiratory status: respiratory function stable Cardiovascular status: stable Postop Assessment: no headache, no backache, epidural receding, patient able to bend at knees, no signs of nausea or vomiting and adequate PO intake Anesthetic complications: no    Last Vitals:  Vitals:   01/02/18 0605 01/02/18 0730  BP: 111/70 (!) 103/53  Pulse: 89 88  Resp: 20   Temp: 37.6 C 36.8 C  SpO2: 97% 96%    Last Pain:  Vitals:   01/02/18 0730  TempSrc: Oral  PainSc: 0-No pain   Pain Goal:                 Carolyn Bush

## 2018-01-02 NOTE — Lactation Note (Signed)
This note was copied from a baby's chart. Lactation Consultation Note  Patient Name: Carolyn Bush [id number] Date: 01/02/2018 Reason for consult: Initial assessment;Term Breastfeeding consultation services and support information given and reviewed.  This is mom's second baby and newborn is 31 hours old.  Mom reports baby is getting a good latch and she knows if he is just nipple feeding.  Mom hand expressing prior to latching baby.  Instructed to feed with cues and to call for assist prn.  Maternal Data Has patient been taught Hand Expression?: Yes Does the patient have breastfeeding experience prior to this delivery?: Yes  Feeding Feeding Type: Breast Fed Length of feed: 10 min  LATCH Score                   Interventions Interventions: Breast feeding basics reviewed  Lactation Tools Discussed/Used     Consult Status Consult Status: Follow-up Date: 01/03/18 Follow-up type: In-patient    Ave Filter 01/02/2018, 2:14 PM

## 2018-01-02 NOTE — Brief Op Note (Signed)
01/01/2018 - 01/02/2018  4:24 AM  PATIENT:  Jimmie Molly  37 y.o. female  PRE-OPERATIVE DIAGNOSIS:  repeat cesarean section for nonreassuring fetal tracing & failed vacuum  POST-OPERATIVE DIAGNOSIS:  repeat cesarean section for nonreassuring fetal tracing & failed vacuum  PROCEDURE:  Procedure(s): CESAREAN SECTION (N/A), Repeat LTCS, 2 layer closure  SURGEON:  Surgeon(s) and Role:    Thurnell Lose, MD - Primary  PHYSICIAN ASSISTANT:   ASSISTANTS: Daphene Calamity, CNM, Technician   ANESTHESIA:   epidural  EBL:  926 mL   BLOOD ADMINISTERED:none  DRAINS: Urinary Catheter (Foley)   LOCAL MEDICATIONS USED:  NONE  SPECIMEN:  No Specimen  DISPOSITION OF SPECIMEN:  N/A  COUNTS:  YES  TOURNIQUET:  * No tourniquets in log *  DICTATION: .Other Dictation: Dictation Number [id number]  PLAN OF CARE: Transfer to postpartum  PATIENT DISPOSITION:  PACU - hemodynamically stable.   Delay start of Pharmacological VTE agent (>24hrs) due to surgical blood loss or risk of bleeding: yes

## 2018-01-02 NOTE — Transfer of Care (Signed)
Immediate Anesthesia Transfer of Care Note  Patient: Carolyn Bush  Procedure(s) Performed: CESAREAN SECTION (N/A )  Patient Location: PACU  Anesthesia Type:Epidural  Level of Consciousness: awake and alert   Airway & Oxygen Therapy: Patient Spontanous Breathing  Post-op Assessment: Report given to RN and Post -op Vital signs reviewed and stable  Post vital signs: Reviewed and stable HR 87, RR 18, SaO2 100%, BP 105/71  Last Vitals:  Vitals:   01/02/18 0030 01/02/18 0100  BP: 113/66 109/70  Pulse: (!) 116 (!) 123  Resp:  20  Temp:    SpO2:      Last Pain:  Vitals:   01/01/18 2215  TempSrc: Oral  PainSc:          Complications: No apparent anesthesia complications

## 2018-01-03 ENCOUNTER — Inpatient Hospital Stay (HOSPITAL_COMMUNITY): Payer: 59

## 2018-01-03 ENCOUNTER — Other Ambulatory Visit: Payer: Self-pay

## 2018-01-03 NOTE — Progress Notes (Signed)
Subjective: Postpartum Day 1: Cesarean Delivery Patient reports incisional pain, tolerating PO, + flatus and no problems voiding.    Objective: Vital signs in last 24 hours: Temp:  [97.8 F (36.6 C)-98.1 F (36.7 C)] 97.8 F (36.6 C) (03/01 0151) Pulse Rate:  [70-72] 70 (03/01 0151) Resp:  [16-19] 16 (03/01 0151) BP: (97-100)/(55-61) 100/55 (03/01 0151) SpO2:  [96 %-99 %] 97 % (03/01 0556)  Physical Exam:  General: alert, cooperative and no distress Lochia: appropriate Uterine Fundus: firm Incision: bandage clean dry and intact  DVT Evaluation: No evidence of DVT seen on physical exam.  Recent Labs    01/01/18 0801 01/02/18 0641  HGB 11.1* 10.3*  HCT 33.6* 30.3*    Assessment/Plan: Status post Cesarean section. Doing well postoperatively.  Continue current care Desires circumcision of infant prior to discharge. Catha Brow. 01/03/2018, 5:47 PM

## 2018-01-04 NOTE — Progress Notes (Signed)
Subjective: Postpartum Day 2: Cesarean Delivery Patient reports tolerating PO, + flatus and no problems voiding.    Objective: Vital signs in last 24 hours: Temp:  [98.2 F (36.8 C)] 98.2 F (36.8 C) (03/02 0623) Pulse Rate:  [60-66] 60 (03/02 0623) Resp:  [16] 16 (03/02 0623) BP: (99-110)/(55-59) 99/59 (03/02 1884)  Physical Exam:  General: alert, cooperative and no distress Lochia: appropriate Uterine Fundus: firm Incision: healing well DVT Evaluation: No evidence of DVT seen on physical exam.  Recent Labs    01/02/18 0641  HGB 10.3*  HCT 30.3*    Assessment/Plan: Status post Cesarean section. Doing well postoperatively.  Continue current care Discharge home in AM.  Marx Doig J. 01/04/2018, 10:08 AM

## 2018-01-05 MED ORDER — OXYCODONE-ACETAMINOPHEN 5-325 MG PO TABS: 1.0000 | ORAL_TABLET | Freq: Four times a day (QID) | ORAL | 0 refills | Status: DC | PRN

## 2018-01-05 MED ORDER — IBUPROFEN 600 MG PO TABS: 600.0000 mg | ORAL_TABLET | Freq: Four times a day (QID) | ORAL | 0 refills | Status: DC

## 2018-01-05 MED ORDER — OXYCODONE-ACETAMINOPHEN 5-325 MG PO TABS: 1.0000 | ORAL_TABLET | ORAL | 0 refills | Status: DC | PRN

## 2018-01-05 NOTE — Op Note (Signed)
Carolyn Bush, CAPANO NO.:  [id number]  MEDICAL RECORD NO.:  [id number]  LOCATION:  [facility]                        FACILITY:  [facility]  PHYSICIAN:  Jola Schmidt, MD   DATE OF BIRTH:  Nov 22, 1980  DATE OF PROCEDURE:  01/02/2018 DATE OF DISCHARGE:                              OPERATIVE REPORT   PREOPERATIVE DIAGNOSES:  Repeat cesarean section due to non-reassuring fetal tracing, failed vacuum and failed trial of labor after cesarean. Suspected macrosomia.  POSTOPERATIVE DIAGNOSES:  Repeat cesarean section due to non-reassuring fetal tracing, failed vacuum and failed trial of labor after cesarean. Suspected macrosomia.  PROCEDURE:  Repeat low transverse cesarean section with two-layer closure.  SURGEONS:  Jola Schmidt, MD.  ASSISTANT:  Daphene Calamity, certified nurse midwife, technician.  ANESTHESIA:  Epidural.  ESTIMATED BLOOD LOSS:  926 mL.  DRAINS:  Foley catheter.  SPECIMEN:  None.  PATIENT DISPOSITION:  To PACU, hemodynamically stable.  FINDINGS:  Distended bladder.  Viable female in the vertex position. Significant kaput.  A normal uterus, fallopian tubes, and ovaries.  The patient was taken to the operating room with IV running.  Epidural anesthesia was optimized.  She was prepped and draped in a normal sterile fashion.  Time-out was performed.  SCDs were on her legs.  Ancef 2 g IV was administered prior to incision in that the C-section was somewhat emergent due to the fetal tracing.  Heart rate was in the 70s when they came in.  A Pfannenstiel skin incision was made over previous incision, which had a keloid and quickly down to the fascia.  The patient had very elastic rectus muscles fascia, very little subcu.  The fascia was then opened with a curved Mayo scissors.  The bladder was quite distended.  Upon opening the fascia, the peritoneum was entered high.  Bladder blade was used to displace the bladder, but it was adhered to the bottom of  the peritoneum, so then we tried in the [place] and that was not adequate either.  The bladder blade was then replaced in that the bladder was distended.  A bladder flap was made quickly and a transverse incision was made on the lower uterine segment and extended bluntly.  There was no sign of uterine rupture.  However, the lower uterine segment was thin.  The baby was delivered from cephalic position.  RN with a sterile hand below brought the baby up to the incision and it was delivered atraumatically.  Nose and mouth were suctioned.  Cord was clamped x2 and cut.  Baby handed off to the awaiting NICU team.  Spontaneous cry noted on field.  The placenta was removed.  Uterus was cleared of all clots and debris.  0 Vicryl was used to close the hysterotomy incision, which there was an extension on the right-hand side.  A second layer of the same suture was used for imbrication.  Hemostasis with 0 Vicryl was needed.  Irrigation was performed of the gutters.  The patient's bladder again was distended which after delivery of the infant did go down a little bit, but I wanted to make sure we did not injure the bladder.  It was inspected.  Foley bulb was  palpated.  There were no injuries at all to the dome of the bladder and we were well away from it.  After the abdomen was inspected and cleared with irrigation, the peritoneum was closed, which was difficult to identify.  So, I attempted to close the peritoneum, but it was difficult to identify.  I then plicated the rectus muscles with the U-stitch with 0 Vicryl.  Fascia was then reapproximated with 0 Vicryl in a continuous locked fashion.  The patient had no subcutaneous tissue, so the skin was reapproximated with 4-0 Monocryl in a subcuticular fashion.  Again, the patient did have a keloid that was not revised prior to making the incision.  Steri-Strips, pressure, or dressing were to be applied.  All instrument, sponge, and needle counts were  correct x3.  The baby remained in the room in stable condition with parents.  The patient tolerated the procedure well.     Jola Schmidt, MD     EBV/MEDQ  D:  01/05/2018  T:  01/05/2018  Job:  [id number]

## 2018-01-05 NOTE — Discharge Instructions (Signed)

## 2018-01-05 NOTE — Discharge Summary (Signed)
OB Discharge Summary     Patient Name: Carolyn Bush DOB: 11/07/1980 MRN: [id number]  Date of admission: 01/01/2018 Delivering MD: Thurnell Lose   Date of discharge: 01/05/2018  Admitting diagnosis: 41.2 WEEKS CTX Intrauterine pregnancy: [redacted]w[redacted]d     Secondary diagnosis:  Active Problems:   Normal labor  Additional problems: None     Discharge diagnosis: Term Pregnancy Delivered and Failed TOLAC, Failed VAVD                                                                                                Post partum procedures:None  Augmentation: Pitocin  Complications: None  Hospital course:  Onset of Labor With Unplanned C/S  37 y.o. yo [id number] at [redacted]w[redacted]d was admitted in [facility] on 01/01/2018. Patient had a labor course significant for Fetal decelerations with pushing. Membrane Rupture Time/Date: 1:10 PM ,01/01/2018   The patient went for cesarean section due to Non-Reassuring FHR and Failed Vaccum, and delivered a Viable infant,01/02/2018  Details of operation can be found in separate operative note. Patient had an uncomplicated postpartum course.  She is ambulating,tolerating a regular diet, passing flatus, and urinating well.  Patient is discharged home in stable condition 01/05/18.  Physical exam  Vitals:   01/04/18 0005 01/04/18 0623 01/04/18 1820 01/05/18 0614  BP: (!) 110/55 (!) 99/59 123/72 (!) 108/58  Pulse: 66 60 77 71  Resp: 16 16 16 16   Temp: 98.2 F (36.8 C) 98.2 F (36.8 C) 97.9 F (36.6 C) 98.1 F (36.7 C)  TempSrc: Oral Oral Oral Oral  SpO2:      Weight:      Height:       General: alert, cooperative and no distress Lochia: appropriate Uterine Fundus: firm Incision: Incision clean, dressing wet DVT Evaluation: No evidence of DVT seen on physical exam. Calf/Ankle edema is present Labs: Lab Results  Component Value Date   WBC 21.7 (H) 01/02/2018   HGB 10.3 (L) 01/02/2018   HCT 30.3 (L) 01/02/2018   MCV 82.1 01/02/2018   PLT 240 01/02/2018    No flowsheet data found.  Discharge instruction: per After Visit Summary and "Baby and Me Booklet".  After visit meds:  Allergies as of 01/05/2018   No Known Allergies     Medication List    TAKE these medications   ibuprofen 600 MG tablet Commonly known as:  ADVIL,MOTRIN Take 1 tablet (600 mg total) by mouth every 6 (six) hours.   oxyCODONE-acetaminophen 5-325 MG tablet Commonly known as:  PERCOCET/ROXICET Take 1 tablet by mouth every 4 (four) hours as needed (pain scale 4-7).   prenatal multivitamin Tabs tablet Take 1 tablet by mouth daily at 12 noon.       Diet: routine diet  Activity: Advance as tolerated. Pelvic rest for 6 weeks.   Outpatient follow up:2 weeks Follow up Appt:No future appointments. Follow up Visit:No Follow-up on file.  Postpartum contraception: Condoms  Newborn Data: Live born female  Birth Weight: 9 lb 8.4 oz (4321 g) APGAR: 9, 9  Newborn Delivery   Birth date/time:  01/02/2018 03:33:00 Delivery type:  C-Section,  Vacuum Assisted C-section categorization:  Repeat     Baby Feeding: Breast Disposition:home with mother   01/05/2018 Thurnell Lose, MD

## 2018-02-17 DIAGNOSIS — Z98891 History of uterine scar from previous surgery: Secondary | ICD-10-CM | POA: Diagnosis not present

## 2018-02-17 DIAGNOSIS — Z30011 Encounter for initial prescription of contraceptive pills: Secondary | ICD-10-CM | POA: Diagnosis not present

## 2018-03-07 MED FILL — NORETHINDRONE 0.35 MG TAB: 0.35 | 84 days supply | Qty: 84 | Fill #0

## 2018-03-27 ENCOUNTER — Ambulatory Visit: Payer: Self-pay

## 2018-03-27 ENCOUNTER — Ambulatory Visit (INDEPENDENT_AMBULATORY_CARE_PROVIDER_SITE_OTHER): Payer: 59 | Admitting: Sports Medicine

## 2018-03-27 ENCOUNTER — Encounter: Payer: Self-pay | Admitting: Sports Medicine

## 2018-03-27 VITALS — BP 110/68 | HR 71 | Ht 67.0 in | Wt 146.5 lb

## 2018-03-27 DIAGNOSIS — M25531 Pain in right wrist: Secondary | ICD-10-CM

## 2018-03-27 DIAGNOSIS — M654 Radial styloid tenosynovitis [de Quervain]: Secondary | ICD-10-CM

## 2018-03-27 NOTE — Patient Instructions (Signed)

## 2018-03-27 NOTE — Progress Notes (Signed)
  Juanda Bond. [place], [place] [facility]  [name] - 37 y.o. female MRN [id number]  Date of birth: Sep 16, 1981  Visit Date: 03/27/2018  PCP: Antony Contras, MD   Referred by: Antony Contras, MD  Scribe for Today's visit is: Anselmo Pickler, LPN  SUBJECTIVE:  Carolyn Bush is here for Initial Assessment (Right wrist pain)  Her Right wrist pain symptoms INITIALLY: Began around 2nd week in March after delivery of son.  Described as severe with certain movements, does not radiate. Worsened with extension of hand, lifting and grabbing items. Improved with rest, Ibuprofen little relief Additional associated symptoms include: None    At this time symptoms show no change compared to onset. She has been using Ibuprofen 400 mg one to two times a day, also was wearing a wrist brace for at least 3 weeks and no change in symptoms.  ROS Reports night time disturbances. Denies fevers, chills, or night sweats. Denies unexplained weight loss. Denies personal history of cancer. Denies changes in bowel or bladder habits. Denies recent unreported falls. Denies new or worsening dyspnea or wheezing. Denies headaches or dizziness.  Denies numbness, tingling or weakness  In the extremities.  Denies dizziness or presyncopal episodes Denies lower extremity edema    HISTORY & PERTINENT PRIOR DATA:  Prior History reviewed and updated per electronic medical record.  Significant/pertinent history, findings, studies include:  reports that she has never smoked. She has never used smokeless tobacco. No results for input(s): HGBA1C, LABURIC, CREATINE in the last 8760 hours. No specialty comments available. Problem  De Quervain's Tenosynovitis    OBJECTIVE:  VS:  HT:5\' 7"  (170.2 cm)   WT:146 lb 8 oz (66.5 kg)  BMI:22.94    BP:110/68  HR:71bpm  TEMP: ( )  RESP:98 %   PHYSICAL EXAM: Constitutional: WDWN, Non-toxic  appearing. Psychiatric: Alert & appropriately interactive.  Not depressed or anxious appearing. Respiratory: No increased work of breathing.  Trachea Midline Eyes: Pupils are equal.  EOM intact without nystagmus.  No scleral icterus  Vascular Exam: warm to touch no edema  upper extremity neuro exam: unremarkable normal strength normal sensation  MSK Exam: Right wrist well aligned with no significant swelling.  No focal bony tenderness.  She has a small amount of pain over the dorsal radial styloid.  Moderate pain with Wynn Maudlin testing.  Thumb strength is otherwise intact.   ASSESSMENT & PLAN:   1. Right wrist pain   2. De Quervain's tenosynovitis     PLAN: Quite impressive de Quervain's on MSK ultrasound on the exam was less impressive.  Ultrasound-guided intratendinous injection performed today.  Continue with bracing and follow-up in 6 weeks for repeat MSK ultrasound.  Follow-up: Return in about 6 weeks (around 05/08/2018).      Please see additional documentation for Objective, Assessment and Plan sections. Pertinent additional documentation may be included in corresponding procedure notes, imaging studies, problem based documentation and patient instructions. Please see these sections of the encounter for additional information regarding this visit.  CMA/ATC served as Education administrator during this visit. History, Physical, and Plan performed by medical provider. Documentation and orders reviewed and attested to.      Gerda Diss, [facility] Physician

## 2018-03-27 NOTE — Procedures (Signed)
PROCEDURE NOTE:  Ultrasound Guided: Injection: Right 1st dorsal compartment Images were obtained and interpreted by myself, Teresa Coombs, DO  Images have been saved and stored to PACS system. Images obtained on: GE S7 Ultrasound machine    ULTRASOUND FINDINGS:  Marked thickening and hypertrophy of the tendon with poor tendon glide.  DESCRIPTION OF PROCEDURE:  The patient's clinical condition is marked by substantial pain and/or significant functional disability. Other conservative therapy has not provided relief, is contraindicated, or not appropriate. There is a reasonable likelihood that injection will significantly improve the patient's pain and/or functional impairment.   After discussing the risks, benefits and expected outcomes of the injection and all questions were reviewed and answered, the patient wished to undergo the above named procedure.  Verbal consent was obtained.  The ultrasound was used to identify the target structure and adjacent neurovascular structures. The skin was then prepped in sterile fashion and the target structure was injected under direct visualization using sterile technique as below:  PREP: Alcohol and Ethel Chloride APPROACH: radial sided, single injection, 25g 1.5 in. INJECTATE: 0.5 cc 1% lidocaine, 0.5 cc 0.5% Marcaine and 0.5 cc 40mg /mL DepoMedrol ASPIRATE: None DRESSING: Band-Aid  Post procedural instructions including recommending icing and warning signs for infection were reviewed.    This procedure was well tolerated and there were no complications.   IMPRESSION: Succesful Ultrasound Guided: Injection

## 2018-04-07 ENCOUNTER — Encounter: Payer: Self-pay | Admitting: Sports Medicine

## 2018-04-07 DIAGNOSIS — M654 Radial styloid tenosynovitis [de Quervain]: Secondary | ICD-10-CM | POA: Insufficient documentation

## 2018-05-01 ENCOUNTER — Other Ambulatory Visit (HOSPITAL_COMMUNITY)
Admission: RE | Admit: 2018-05-01 | Discharge: 2018-05-01 | Disposition: A | Discharge status: Home / Self Care | Payer: 59 | Source: Ambulatory Visit | Attending: Obstetrics and Gynecology | Admitting: Obstetrics and Gynecology

## 2018-05-01 ENCOUNTER — Other Ambulatory Visit: Payer: Self-pay | Admitting: Obstetrics and Gynecology

## 2018-05-01 DIAGNOSIS — Z01411 Encounter for gynecological examination (general) (routine) with abnormal findings: Secondary | ICD-10-CM | POA: Insufficient documentation

## 2018-05-01 DIAGNOSIS — N952 Postmenopausal atrophic vaginitis: Secondary | ICD-10-CM | POA: Diagnosis not present

## 2018-05-01 DIAGNOSIS — N898 Other specified noninflammatory disorders of vagina: Secondary | ICD-10-CM | POA: Diagnosis not present

## 2018-05-01 DIAGNOSIS — Z01419 Encounter for gynecological examination (general) (routine) without abnormal findings: Secondary | ICD-10-CM | POA: Diagnosis not present

## 2018-05-05 LAB — CYTOLOGY - PAP
Diagnosis: NEGATIVE
HPV: NOT DETECTED

## 2018-05-21 MED FILL — NORETHINDRONE 0.35 MG TAB: 0.35 | 84 days supply | Qty: 84 | Fill #1

## 2018-05-21 MED FILL — PREMARIN VAGINAL CREAM-APPL: 0.625 | 90 days supply | Qty: 30 | Fill #0

## 2018-08-20 MED FILL — NORETHINDRONE 0.35 MG TAB: 0.35 | 84 days supply | Qty: 84 | Fill #2

## 2018-08-27 DIAGNOSIS — Z23 Encounter for immunization: Secondary | ICD-10-CM | POA: Diagnosis not present

## 2018-11-17 MED FILL — NORLYDA 0.35 MG TABS: 0.35 | 84 days supply | Qty: 84 | Fill #3

## 2019-01-23 MED FILL — NORLYDA 0.35 MG TABS: 0.35 | 84 days supply | Qty: 84 | Fill #0

## 2019-04-28 MED FILL — NORLYDA 0.35 MG TABS: 0.35 | 84 days supply | Qty: 84 | Fill #0

## 2019-07-24 MED FILL — NORLYDA 0.35 MG TABS: 0.35 | 84 days supply | Qty: 84 | Fill #1

## 2019-08-05 DIAGNOSIS — Z23 Encounter for immunization: Secondary | ICD-10-CM | POA: Diagnosis not present

## 2019-08-26 ENCOUNTER — Other Ambulatory Visit: Payer: Self-pay

## 2019-08-26 DIAGNOSIS — Z20822 Contact with and (suspected) exposure to covid-19: Secondary | ICD-10-CM

## 2019-08-27 LAB — NOVEL CORONAVIRUS, NAA: SARS-CoV-2, NAA: NOT DETECTED

## 2019-09-29 DIAGNOSIS — Z30011 Encounter for initial prescription of contraceptive pills: Secondary | ICD-10-CM | POA: Diagnosis not present

## 2019-09-29 DIAGNOSIS — N939 Abnormal uterine and vaginal bleeding, unspecified: Secondary | ICD-10-CM | POA: Diagnosis not present

## 2019-09-29 DIAGNOSIS — Z01419 Encounter for gynecological examination (general) (routine) without abnormal findings: Secondary | ICD-10-CM | POA: Diagnosis not present

## 2019-09-29 MED FILL — FEMYNOR 0.25-35 MG-MCG TABS: 0.25-35 | 84 days supply | Qty: 84 | Fill #0

## 2019-11-18 DIAGNOSIS — N939 Abnormal uterine and vaginal bleeding, unspecified: Secondary | ICD-10-CM | POA: Diagnosis not present

## 2019-12-07 MED FILL — FEMYNOR 0.25-35 MG-MCG TABS: 0.25-35 | 84 days supply | Qty: 84 | Fill #1

## 2020-01-06 DIAGNOSIS — Z3041 Encounter for surveillance of contraceptive pills: Secondary | ICD-10-CM | POA: Diagnosis not present

## 2020-01-06 DIAGNOSIS — N926 Irregular menstruation, unspecified: Secondary | ICD-10-CM | POA: Diagnosis not present

## 2020-02-03 ENCOUNTER — Ambulatory Visit: Payer: 59 | Attending: Internal Medicine

## 2020-02-03 DIAGNOSIS — Z20822 Contact with and (suspected) exposure to covid-19: Secondary | ICD-10-CM | POA: Diagnosis not present

## 2020-02-04 LAB — NOVEL CORONAVIRUS, NAA: SARS-CoV-2, NAA: NOT DETECTED

## 2020-03-02 MED FILL — FEMYNOR 0.25-35 MG-MCG TABS: 0.25-35 | 84 days supply | Qty: 84 | Fill #2

## 2020-05-16 MED FILL — FEMYNOR 0.25-35 MG-MCG TABS: 0.25-35 | 84 days supply | Qty: 84 | Fill #3

## 2020-07-07 DIAGNOSIS — D235 Other benign neoplasm of skin of trunk: Secondary | ICD-10-CM | POA: Diagnosis not present

## 2020-07-28 ENCOUNTER — Other Ambulatory Visit (HOSPITAL_COMMUNITY): Payer: Self-pay | Admitting: Obstetrics and Gynecology

## 2020-08-05 MED FILL — FEMYNOR 0.25-35 MG-MCG TABS: 0.25-35 | 84 days supply | Qty: 84 | Fill #0

## 2020-10-05 DIAGNOSIS — Z01419 Encounter for gynecological examination (general) (routine) without abnormal findings: Secondary | ICD-10-CM | POA: Diagnosis not present

## 2020-10-05 DIAGNOSIS — Z309 Encounter for contraceptive management, unspecified: Secondary | ICD-10-CM | POA: Diagnosis not present

## 2020-11-10 ENCOUNTER — Other Ambulatory Visit: Payer: 59

## 2020-11-10 DIAGNOSIS — Z20822 Contact with and (suspected) exposure to covid-19: Secondary | ICD-10-CM

## 2020-11-11 LAB — NOVEL CORONAVIRUS, NAA: SARS-CoV-2, NAA: DETECTED — AB

## 2020-11-11 LAB — SARS-COV-2, NAA 2 DAY TAT

## 2021-05-05 ENCOUNTER — Other Ambulatory Visit (HOSPITAL_COMMUNITY): Payer: Self-pay

## 2021-05-05 DIAGNOSIS — R0981 Nasal congestion: Secondary | ICD-10-CM | POA: Diagnosis not present

## 2021-05-05 DIAGNOSIS — R5383 Other fatigue: Secondary | ICD-10-CM | POA: Diagnosis not present

## 2021-05-05 DIAGNOSIS — J40 Bronchitis, not specified as acute or chronic: Secondary | ICD-10-CM | POA: Diagnosis not present

## 2021-05-05 DIAGNOSIS — J019 Acute sinusitis, unspecified: Secondary | ICD-10-CM | POA: Diagnosis not present

## 2021-05-05 DIAGNOSIS — R059 Cough, unspecified: Secondary | ICD-10-CM | POA: Diagnosis not present

## 2021-05-05 MED ORDER — FLUTICASONE PROPIONATE 50 MCG/ACT NA SUSP
NASAL | 0 refills | Status: DC
  Filled 2021-05-05: qty 16, 30d supply, fill #0

## 2021-05-05 MED ORDER — AMOXICILLIN-POT CLAVULANATE 875-125 MG PO TABS
1.0000 | ORAL_TABLET | Freq: Two times a day (BID) | ORAL | 0 refills | Status: DC
  Filled 2021-05-05: qty 14, 7d supply, fill #0

## 2021-05-05 MED ORDER — BENZONATATE 100 MG PO CAPS
100.0000 mg | ORAL_CAPSULE | Freq: Three times a day (TID) | ORAL | 0 refills | Status: AC | PRN
  Filled 2021-05-05: qty 30, 5d supply, fill #0

## 2021-07-25 DIAGNOSIS — Z3202 Encounter for pregnancy test, result negative: Secondary | ICD-10-CM | POA: Diagnosis not present

## 2021-07-25 DIAGNOSIS — N939 Abnormal uterine and vaginal bleeding, unspecified: Secondary | ICD-10-CM | POA: Diagnosis not present

## 2021-07-25 DIAGNOSIS — N814 Uterovaginal prolapse, unspecified: Secondary | ICD-10-CM | POA: Diagnosis not present

## 2021-08-02 DIAGNOSIS — N939 Abnormal uterine and vaginal bleeding, unspecified: Secondary | ICD-10-CM | POA: Diagnosis not present

## 2021-08-08 ENCOUNTER — Other Ambulatory Visit (HOSPITAL_COMMUNITY): Payer: Self-pay

## 2021-08-08 DIAGNOSIS — Z3202 Encounter for pregnancy test, result negative: Secondary | ICD-10-CM | POA: Diagnosis not present

## 2021-08-08 DIAGNOSIS — Z3042 Encounter for surveillance of injectable contraceptive: Secondary | ICD-10-CM | POA: Diagnosis not present

## 2021-08-08 MED ORDER — MEDROXYPROGESTERONE ACETATE 150 MG/ML IM SUSY
PREFILLED_SYRINGE | INTRAMUSCULAR | 3 refills | Status: DC
  Filled 2021-08-08: qty 1, 84d supply, fill #0

## 2021-09-25 DIAGNOSIS — Z23 Encounter for immunization: Secondary | ICD-10-CM | POA: Diagnosis not present

## 2021-09-25 DIAGNOSIS — N83201 Unspecified ovarian cyst, right side: Secondary | ICD-10-CM | POA: Diagnosis not present

## 2021-10-11 DIAGNOSIS — Z01419 Encounter for gynecological examination (general) (routine) without abnormal findings: Secondary | ICD-10-CM | POA: Diagnosis not present

## 2021-10-20 ENCOUNTER — Other Ambulatory Visit (HOSPITAL_COMMUNITY): Payer: Self-pay

## 2021-10-20 MED ORDER — MEDROXYPROGESTERONE ACETATE 150 MG/ML IM SUSY
150.0000 mg | PREFILLED_SYRINGE | INTRAMUSCULAR | 2 refills | Status: DC
  Filled 2021-10-20: qty 1, 90d supply, fill #0
  Filled 2022-01-11: qty 1, 90d supply, fill #1

## 2021-10-24 DIAGNOSIS — Z3042 Encounter for surveillance of injectable contraceptive: Secondary | ICD-10-CM | POA: Diagnosis not present

## 2022-01-11 ENCOUNTER — Other Ambulatory Visit (HOSPITAL_COMMUNITY): Payer: Self-pay

## 2022-01-12 DIAGNOSIS — Z3042 Encounter for surveillance of injectable contraceptive: Secondary | ICD-10-CM | POA: Diagnosis not present

## 2022-02-22 DIAGNOSIS — U071 COVID-19: Secondary | ICD-10-CM | POA: Diagnosis not present

## 2022-08-14 ENCOUNTER — Other Ambulatory Visit (HOSPITAL_BASED_OUTPATIENT_CLINIC_OR_DEPARTMENT_OTHER): Payer: Self-pay | Admitting: Family Medicine

## 2022-08-14 DIAGNOSIS — Z1231 Encounter for screening mammogram for malignant neoplasm of breast: Secondary | ICD-10-CM

## 2022-08-17 ENCOUNTER — Ambulatory Visit (HOSPITAL_BASED_OUTPATIENT_CLINIC_OR_DEPARTMENT_OTHER)
Admission: RE | Admit: 2022-08-17 | Discharge: 2022-08-17 | Disposition: A | Discharge status: Home / Self Care | Payer: 59 | Source: Ambulatory Visit | Attending: Family Medicine | Admitting: Family Medicine

## 2022-08-17 DIAGNOSIS — Z1231 Encounter for screening mammogram for malignant neoplasm of breast: Secondary | ICD-10-CM | POA: Insufficient documentation

## 2022-10-15 DIAGNOSIS — Z01419 Encounter for gynecological examination (general) (routine) without abnormal findings: Secondary | ICD-10-CM | POA: Diagnosis not present

## 2022-11-30 DIAGNOSIS — H5203 Hypermetropia, bilateral: Secondary | ICD-10-CM | POA: Diagnosis not present

## 2023-01-09 DIAGNOSIS — O09521 Supervision of elderly multigravida, first trimester: Secondary | ICD-10-CM | POA: Diagnosis not present

## 2023-01-09 DIAGNOSIS — Z3480 Encounter for supervision of other normal pregnancy, unspecified trimester: Secondary | ICD-10-CM | POA: Diagnosis not present

## 2023-01-09 LAB — OB RESULTS CONSOLE ABO/RH: RH Type: POSITIVE

## 2023-01-09 LAB — OB RESULTS CONSOLE HIV ANTIBODY (ROUTINE TESTING): HIV: NONREACTIVE

## 2023-01-09 LAB — HEPATITIS C ANTIBODY: HCV Ab: NEGATIVE

## 2023-01-09 LAB — OB RESULTS CONSOLE RUBELLA ANTIBODY, IGM: Rubella: IMMUNE

## 2023-01-09 LAB — OB RESULTS CONSOLE HEPATITIS B SURFACE ANTIGEN: Hepatitis B Surface Ag: NEGATIVE

## 2023-01-15 ENCOUNTER — Other Ambulatory Visit: Payer: Self-pay | Admitting: Obstetrics and Gynecology

## 2023-01-15 DIAGNOSIS — O368310 Maternal care for abnormalities of the fetal heart rate or rhythm, first trimester, not applicable or unspecified: Secondary | ICD-10-CM | POA: Diagnosis not present

## 2023-01-15 DIAGNOSIS — O30009 Twin pregnancy, unspecified number of placenta and unspecified number of amniotic sacs, unspecified trimester: Secondary | ICD-10-CM | POA: Diagnosis not present

## 2023-01-15 DIAGNOSIS — Z363 Encounter for antenatal screening for malformations: Secondary | ICD-10-CM

## 2023-01-15 DIAGNOSIS — Z3481 Encounter for supervision of other normal pregnancy, first trimester: Secondary | ICD-10-CM | POA: Diagnosis not present

## 2023-01-15 DIAGNOSIS — O09521 Supervision of elderly multigravida, first trimester: Secondary | ICD-10-CM | POA: Diagnosis not present

## 2023-01-15 LAB — OB RESULTS CONSOLE GC/CHLAMYDIA
Chlamydia: NEGATIVE
Neisseria Gonorrhea: NEGATIVE

## 2023-03-14 DIAGNOSIS — R031 Nonspecific low blood-pressure reading: Secondary | ICD-10-CM | POA: Diagnosis not present

## 2023-03-14 DIAGNOSIS — Z0000 Encounter for general adult medical examination without abnormal findings: Secondary | ICD-10-CM | POA: Diagnosis not present

## 2023-03-14 DIAGNOSIS — O30049 Twin pregnancy, dichorionic/diamniotic, unspecified trimester: Secondary | ICD-10-CM | POA: Diagnosis not present

## 2023-03-14 DIAGNOSIS — E7800 Pure hypercholesterolemia, unspecified: Secondary | ICD-10-CM | POA: Diagnosis not present

## 2023-03-25 ENCOUNTER — Encounter: Payer: Self-pay | Admitting: *Deleted

## 2023-03-25 DIAGNOSIS — O09529 Supervision of elderly multigravida, unspecified trimester: Secondary | ICD-10-CM | POA: Insufficient documentation

## 2023-03-25 DIAGNOSIS — O34219 Maternal care for unspecified type scar from previous cesarean delivery: Secondary | ICD-10-CM | POA: Insufficient documentation

## 2023-03-25 DIAGNOSIS — O30049 Twin pregnancy, dichorionic/diamniotic, unspecified trimester: Secondary | ICD-10-CM | POA: Insufficient documentation

## 2023-03-25 DIAGNOSIS — D573 Sickle-cell trait: Secondary | ICD-10-CM | POA: Insufficient documentation

## 2023-03-26 ENCOUNTER — Ambulatory Visit: Payer: 59 | Attending: Obstetrics and Gynecology

## 2023-03-26 ENCOUNTER — Other Ambulatory Visit: Payer: Self-pay | Admitting: *Deleted

## 2023-03-26 ENCOUNTER — Ambulatory Visit: Payer: 59

## 2023-03-26 VITALS — BP 106/71 | HR 75

## 2023-03-26 DIAGNOSIS — O09522 Supervision of elderly multigravida, second trimester: Secondary | ICD-10-CM

## 2023-03-26 DIAGNOSIS — Z363 Encounter for antenatal screening for malformations: Secondary | ICD-10-CM | POA: Diagnosis not present

## 2023-03-26 DIAGNOSIS — O34219 Maternal care for unspecified type scar from previous cesarean delivery: Secondary | ICD-10-CM

## 2023-03-26 DIAGNOSIS — O30032 Twin pregnancy, monochorionic/diamniotic, second trimester: Secondary | ICD-10-CM

## 2023-03-26 DIAGNOSIS — D573 Sickle-cell trait: Secondary | ICD-10-CM

## 2023-03-26 DIAGNOSIS — O30049 Twin pregnancy, dichorionic/diamniotic, unspecified trimester: Secondary | ICD-10-CM | POA: Insufficient documentation

## 2023-03-26 DIAGNOSIS — O09529 Supervision of elderly multigravida, unspecified trimester: Secondary | ICD-10-CM | POA: Insufficient documentation

## 2023-04-10 ENCOUNTER — Ambulatory Visit: Payer: 59 | Attending: Maternal & Fetal Medicine

## 2023-04-10 ENCOUNTER — Ambulatory Visit: Payer: 59 | Admitting: *Deleted

## 2023-04-10 VITALS — BP 103/52 | HR 68

## 2023-04-10 DIAGNOSIS — Z3A21 21 weeks gestation of pregnancy: Secondary | ICD-10-CM | POA: Diagnosis not present

## 2023-04-10 DIAGNOSIS — D573 Sickle-cell trait: Secondary | ICD-10-CM | POA: Diagnosis not present

## 2023-04-10 DIAGNOSIS — O34219 Maternal care for unspecified type scar from previous cesarean delivery: Secondary | ICD-10-CM

## 2023-04-10 DIAGNOSIS — O30042 Twin pregnancy, dichorionic/diamniotic, second trimester: Secondary | ICD-10-CM | POA: Insufficient documentation

## 2023-04-10 DIAGNOSIS — O30032 Twin pregnancy, monochorionic/diamniotic, second trimester: Secondary | ICD-10-CM | POA: Diagnosis not present

## 2023-04-10 DIAGNOSIS — O09522 Supervision of elderly multigravida, second trimester: Secondary | ICD-10-CM

## 2023-04-19 ENCOUNTER — Encounter: Payer: Self-pay | Admitting: Maternal & Fetal Medicine

## 2023-04-24 ENCOUNTER — Ambulatory Visit: Payer: 59 | Attending: Maternal & Fetal Medicine

## 2023-04-24 ENCOUNTER — Other Ambulatory Visit: Payer: Self-pay | Admitting: *Deleted

## 2023-04-24 ENCOUNTER — Ambulatory Visit: Payer: 59 | Admitting: *Deleted

## 2023-04-24 VITALS — BP 102/53 | HR 80

## 2023-04-24 DIAGNOSIS — O09522 Supervision of elderly multigravida, second trimester: Secondary | ICD-10-CM

## 2023-04-24 DIAGNOSIS — O99012 Anemia complicating pregnancy, second trimester: Secondary | ICD-10-CM | POA: Diagnosis not present

## 2023-04-24 DIAGNOSIS — O35BXX1 Maternal care for other (suspected) fetal abnormality and damage, fetal cardiac anomalies, fetus 1: Secondary | ICD-10-CM | POA: Diagnosis not present

## 2023-04-24 DIAGNOSIS — D573 Sickle-cell trait: Secondary | ICD-10-CM

## 2023-04-24 DIAGNOSIS — O34219 Maternal care for unspecified type scar from previous cesarean delivery: Secondary | ICD-10-CM | POA: Diagnosis not present

## 2023-04-24 DIAGNOSIS — O30032 Twin pregnancy, monochorionic/diamniotic, second trimester: Secondary | ICD-10-CM | POA: Diagnosis not present

## 2023-04-24 DIAGNOSIS — Z3A23 23 weeks gestation of pregnancy: Secondary | ICD-10-CM

## 2023-04-25 DIAGNOSIS — Z3482 Encounter for supervision of other normal pregnancy, second trimester: Secondary | ICD-10-CM | POA: Diagnosis not present

## 2023-04-25 DIAGNOSIS — M9904 Segmental and somatic dysfunction of sacral region: Secondary | ICD-10-CM | POA: Diagnosis not present

## 2023-04-25 DIAGNOSIS — Z3490 Encounter for supervision of normal pregnancy, unspecified, unspecified trimester: Secondary | ICD-10-CM | POA: Diagnosis not present

## 2023-04-25 DIAGNOSIS — M9903 Segmental and somatic dysfunction of lumbar region: Secondary | ICD-10-CM | POA: Diagnosis not present

## 2023-04-25 DIAGNOSIS — M5441 Lumbago with sciatica, right side: Secondary | ICD-10-CM | POA: Diagnosis not present

## 2023-04-25 DIAGNOSIS — M9905 Segmental and somatic dysfunction of pelvic region: Secondary | ICD-10-CM | POA: Diagnosis not present

## 2023-04-25 DIAGNOSIS — M5442 Lumbago with sciatica, left side: Secondary | ICD-10-CM | POA: Diagnosis not present

## 2023-04-25 DIAGNOSIS — Z333 Pregnant state, gestational carrier: Secondary | ICD-10-CM | POA: Diagnosis not present

## 2023-04-25 LAB — OB RESULTS CONSOLE RPR: RPR: NONREACTIVE

## 2023-05-07 ENCOUNTER — Ambulatory Visit: Payer: 59

## 2023-05-07 ENCOUNTER — Ambulatory Visit: Payer: 59 | Admitting: *Deleted

## 2023-05-07 ENCOUNTER — Other Ambulatory Visit: Payer: Self-pay | Admitting: *Deleted

## 2023-05-07 ENCOUNTER — Encounter: Payer: Self-pay | Admitting: *Deleted

## 2023-05-07 ENCOUNTER — Ambulatory Visit: Payer: 59 | Attending: Maternal & Fetal Medicine

## 2023-05-07 DIAGNOSIS — D573 Sickle-cell trait: Secondary | ICD-10-CM | POA: Diagnosis not present

## 2023-05-07 DIAGNOSIS — O34219 Maternal care for unspecified type scar from previous cesarean delivery: Secondary | ICD-10-CM | POA: Insufficient documentation

## 2023-05-07 DIAGNOSIS — O30032 Twin pregnancy, monochorionic/diamniotic, second trimester: Secondary | ICD-10-CM | POA: Insufficient documentation

## 2023-05-07 DIAGNOSIS — O30039 Twin pregnancy, monochorionic/diamniotic, unspecified trimester: Secondary | ICD-10-CM

## 2023-05-07 DIAGNOSIS — Z3A25 25 weeks gestation of pregnancy: Secondary | ICD-10-CM

## 2023-05-07 DIAGNOSIS — O35BXX1 Maternal care for other (suspected) fetal abnormality and damage, fetal cardiac anomalies, fetus 1: Secondary | ICD-10-CM

## 2023-05-07 DIAGNOSIS — O09522 Supervision of elderly multigravida, second trimester: Secondary | ICD-10-CM | POA: Insufficient documentation

## 2023-05-21 ENCOUNTER — Ambulatory Visit: Payer: 59 | Attending: Maternal & Fetal Medicine

## 2023-05-21 ENCOUNTER — Other Ambulatory Visit: Payer: Self-pay

## 2023-05-21 ENCOUNTER — Ambulatory Visit: Payer: 59

## 2023-05-21 VITALS — BP 109/58 | HR 79

## 2023-05-21 DIAGNOSIS — O30032 Twin pregnancy, monochorionic/diamniotic, second trimester: Secondary | ICD-10-CM | POA: Insufficient documentation

## 2023-05-21 DIAGNOSIS — O35BXX1 Maternal care for other (suspected) fetal abnormality and damage, fetal cardiac anomalies, fetus 1: Secondary | ICD-10-CM

## 2023-05-21 DIAGNOSIS — O30033 Twin pregnancy, monochorionic/diamniotic, third trimester: Secondary | ICD-10-CM

## 2023-05-21 DIAGNOSIS — O09522 Supervision of elderly multigravida, second trimester: Secondary | ICD-10-CM | POA: Diagnosis not present

## 2023-05-21 DIAGNOSIS — Z3A27 27 weeks gestation of pregnancy: Secondary | ICD-10-CM | POA: Diagnosis not present

## 2023-05-21 DIAGNOSIS — D573 Sickle-cell trait: Secondary | ICD-10-CM

## 2023-05-21 DIAGNOSIS — O34219 Maternal care for unspecified type scar from previous cesarean delivery: Secondary | ICD-10-CM | POA: Diagnosis not present

## 2023-05-21 DIAGNOSIS — O09523 Supervision of elderly multigravida, third trimester: Secondary | ICD-10-CM

## 2023-05-21 DIAGNOSIS — O99012 Anemia complicating pregnancy, second trimester: Secondary | ICD-10-CM

## 2023-05-23 DIAGNOSIS — M9905 Segmental and somatic dysfunction of pelvic region: Secondary | ICD-10-CM | POA: Diagnosis not present

## 2023-05-23 DIAGNOSIS — M9904 Segmental and somatic dysfunction of sacral region: Secondary | ICD-10-CM | POA: Diagnosis not present

## 2023-05-23 DIAGNOSIS — M9906 Segmental and somatic dysfunction of lower extremity: Secondary | ICD-10-CM | POA: Diagnosis not present

## 2023-05-23 DIAGNOSIS — M5442 Lumbago with sciatica, left side: Secondary | ICD-10-CM | POA: Diagnosis not present

## 2023-05-23 DIAGNOSIS — M5441 Lumbago with sciatica, right side: Secondary | ICD-10-CM | POA: Diagnosis not present

## 2023-05-29 ENCOUNTER — Other Ambulatory Visit: Payer: Self-pay | Admitting: *Deleted

## 2023-05-29 DIAGNOSIS — O09522 Supervision of elderly multigravida, second trimester: Secondary | ICD-10-CM

## 2023-05-29 DIAGNOSIS — O30032 Twin pregnancy, monochorionic/diamniotic, second trimester: Secondary | ICD-10-CM

## 2023-05-29 DIAGNOSIS — O34219 Maternal care for unspecified type scar from previous cesarean delivery: Secondary | ICD-10-CM

## 2023-06-04 ENCOUNTER — Encounter: Payer: Self-pay | Admitting: *Deleted

## 2023-06-04 ENCOUNTER — Ambulatory Visit: Payer: 59 | Admitting: *Deleted

## 2023-06-04 ENCOUNTER — Ambulatory Visit: Payer: 59 | Attending: Maternal & Fetal Medicine

## 2023-06-04 VITALS — BP 99/51 | HR 80

## 2023-06-04 DIAGNOSIS — D573 Sickle-cell trait: Secondary | ICD-10-CM | POA: Diagnosis not present

## 2023-06-04 DIAGNOSIS — Q210 Ventricular septal defect: Secondary | ICD-10-CM | POA: Insufficient documentation

## 2023-06-04 DIAGNOSIS — O0993 Supervision of high risk pregnancy, unspecified, third trimester: Secondary | ICD-10-CM | POA: Diagnosis not present

## 2023-06-04 DIAGNOSIS — O35BXX1 Maternal care for other (suspected) fetal abnormality and damage, fetal cardiac anomalies, fetus 1: Secondary | ICD-10-CM

## 2023-06-04 DIAGNOSIS — O99013 Anemia complicating pregnancy, third trimester: Secondary | ICD-10-CM | POA: Diagnosis not present

## 2023-06-04 DIAGNOSIS — O30039 Twin pregnancy, monochorionic/diamniotic, unspecified trimester: Secondary | ICD-10-CM | POA: Insufficient documentation

## 2023-06-04 DIAGNOSIS — O34219 Maternal care for unspecified type scar from previous cesarean delivery: Secondary | ICD-10-CM

## 2023-06-04 DIAGNOSIS — O09522 Supervision of elderly multigravida, second trimester: Secondary | ICD-10-CM

## 2023-06-04 DIAGNOSIS — O30033 Twin pregnancy, monochorionic/diamniotic, third trimester: Secondary | ICD-10-CM | POA: Diagnosis not present

## 2023-06-04 DIAGNOSIS — Z3A29 29 weeks gestation of pregnancy: Secondary | ICD-10-CM

## 2023-06-12 DIAGNOSIS — Z23 Encounter for immunization: Secondary | ICD-10-CM | POA: Diagnosis not present

## 2023-06-12 DIAGNOSIS — O30033 Twin pregnancy, monochorionic/diamniotic, third trimester: Secondary | ICD-10-CM | POA: Diagnosis not present

## 2023-06-18 ENCOUNTER — Ambulatory Visit: Payer: 59 | Admitting: *Deleted

## 2023-06-18 ENCOUNTER — Encounter: Payer: Self-pay | Admitting: *Deleted

## 2023-06-18 ENCOUNTER — Ambulatory Visit: Payer: 59 | Attending: Maternal & Fetal Medicine

## 2023-06-18 ENCOUNTER — Other Ambulatory Visit: Payer: Self-pay | Admitting: *Deleted

## 2023-06-18 DIAGNOSIS — O99013 Anemia complicating pregnancy, third trimester: Secondary | ICD-10-CM | POA: Diagnosis not present

## 2023-06-18 DIAGNOSIS — O30039 Twin pregnancy, monochorionic/diamniotic, unspecified trimester: Secondary | ICD-10-CM | POA: Insufficient documentation

## 2023-06-18 DIAGNOSIS — O30033 Twin pregnancy, monochorionic/diamniotic, third trimester: Secondary | ICD-10-CM

## 2023-06-18 DIAGNOSIS — O34219 Maternal care for unspecified type scar from previous cesarean delivery: Secondary | ICD-10-CM

## 2023-06-18 DIAGNOSIS — O30032 Twin pregnancy, monochorionic/diamniotic, second trimester: Secondary | ICD-10-CM | POA: Diagnosis not present

## 2023-06-18 DIAGNOSIS — O35BXX1 Maternal care for other (suspected) fetal abnormality and damage, fetal cardiac anomalies, fetus 1: Secondary | ICD-10-CM | POA: Diagnosis not present

## 2023-06-18 DIAGNOSIS — O09523 Supervision of elderly multigravida, third trimester: Secondary | ICD-10-CM

## 2023-06-18 DIAGNOSIS — D573 Sickle-cell trait: Secondary | ICD-10-CM | POA: Diagnosis not present

## 2023-06-18 DIAGNOSIS — O09522 Supervision of elderly multigravida, second trimester: Secondary | ICD-10-CM | POA: Diagnosis not present

## 2023-06-18 DIAGNOSIS — Z3A31 31 weeks gestation of pregnancy: Secondary | ICD-10-CM

## 2023-06-20 DIAGNOSIS — M5441 Lumbago with sciatica, right side: Secondary | ICD-10-CM | POA: Diagnosis not present

## 2023-06-20 DIAGNOSIS — M9905 Segmental and somatic dysfunction of pelvic region: Secondary | ICD-10-CM | POA: Diagnosis not present

## 2023-06-20 DIAGNOSIS — Z333 Pregnant state, gestational carrier: Secondary | ICD-10-CM | POA: Diagnosis not present

## 2023-06-20 DIAGNOSIS — M9904 Segmental and somatic dysfunction of sacral region: Secondary | ICD-10-CM | POA: Diagnosis not present

## 2023-06-20 DIAGNOSIS — M5442 Lumbago with sciatica, left side: Secondary | ICD-10-CM | POA: Diagnosis not present

## 2023-06-20 DIAGNOSIS — M9903 Segmental and somatic dysfunction of lumbar region: Secondary | ICD-10-CM | POA: Diagnosis not present

## 2023-06-25 ENCOUNTER — Ambulatory Visit: Payer: 59 | Admitting: *Deleted

## 2023-06-25 ENCOUNTER — Encounter: Payer: Self-pay | Admitting: *Deleted

## 2023-06-25 ENCOUNTER — Ambulatory Visit: Payer: 59 | Attending: Obstetrics and Gynecology

## 2023-06-25 DIAGNOSIS — D573 Sickle-cell trait: Secondary | ICD-10-CM | POA: Diagnosis not present

## 2023-06-25 DIAGNOSIS — Z3A32 32 weeks gestation of pregnancy: Secondary | ICD-10-CM | POA: Diagnosis not present

## 2023-06-25 DIAGNOSIS — O30032 Twin pregnancy, monochorionic/diamniotic, second trimester: Secondary | ICD-10-CM | POA: Insufficient documentation

## 2023-06-25 DIAGNOSIS — O35BXX1 Maternal care for other (suspected) fetal abnormality and damage, fetal cardiac anomalies, fetus 1: Secondary | ICD-10-CM | POA: Diagnosis not present

## 2023-06-25 DIAGNOSIS — O34219 Maternal care for unspecified type scar from previous cesarean delivery: Secondary | ICD-10-CM

## 2023-06-25 DIAGNOSIS — O09523 Supervision of elderly multigravida, third trimester: Secondary | ICD-10-CM

## 2023-06-25 DIAGNOSIS — O30033 Twin pregnancy, monochorionic/diamniotic, third trimester: Secondary | ICD-10-CM | POA: Diagnosis not present

## 2023-06-25 DIAGNOSIS — O09522 Supervision of elderly multigravida, second trimester: Secondary | ICD-10-CM | POA: Insufficient documentation

## 2023-07-02 ENCOUNTER — Ambulatory Visit: Payer: 59 | Admitting: *Deleted

## 2023-07-02 ENCOUNTER — Encounter: Payer: Self-pay | Admitting: *Deleted

## 2023-07-02 ENCOUNTER — Ambulatory Visit: Payer: 59 | Attending: Obstetrics and Gynecology

## 2023-07-02 DIAGNOSIS — D573 Sickle-cell trait: Secondary | ICD-10-CM

## 2023-07-02 DIAGNOSIS — O34219 Maternal care for unspecified type scar from previous cesarean delivery: Secondary | ICD-10-CM

## 2023-07-02 DIAGNOSIS — O35BXX1 Maternal care for other (suspected) fetal abnormality and damage, fetal cardiac anomalies, fetus 1: Secondary | ICD-10-CM

## 2023-07-02 DIAGNOSIS — O99013 Anemia complicating pregnancy, third trimester: Secondary | ICD-10-CM | POA: Diagnosis not present

## 2023-07-02 DIAGNOSIS — O09523 Supervision of elderly multigravida, third trimester: Secondary | ICD-10-CM

## 2023-07-02 DIAGNOSIS — O09522 Supervision of elderly multigravida, second trimester: Secondary | ICD-10-CM | POA: Diagnosis not present

## 2023-07-02 DIAGNOSIS — O30033 Twin pregnancy, monochorionic/diamniotic, third trimester: Secondary | ICD-10-CM

## 2023-07-02 DIAGNOSIS — Z3A33 33 weeks gestation of pregnancy: Secondary | ICD-10-CM

## 2023-07-02 DIAGNOSIS — O30032 Twin pregnancy, monochorionic/diamniotic, second trimester: Secondary | ICD-10-CM | POA: Diagnosis not present

## 2023-07-09 ENCOUNTER — Ambulatory Visit: Payer: 59 | Attending: Obstetrics and Gynecology

## 2023-07-09 ENCOUNTER — Encounter: Payer: Self-pay | Admitting: *Deleted

## 2023-07-09 ENCOUNTER — Ambulatory Visit: Payer: 59 | Admitting: *Deleted

## 2023-07-09 DIAGNOSIS — O30033 Twin pregnancy, monochorionic/diamniotic, third trimester: Secondary | ICD-10-CM | POA: Insufficient documentation

## 2023-07-09 DIAGNOSIS — O34219 Maternal care for unspecified type scar from previous cesarean delivery: Secondary | ICD-10-CM | POA: Diagnosis not present

## 2023-07-09 DIAGNOSIS — O09523 Supervision of elderly multigravida, third trimester: Secondary | ICD-10-CM | POA: Insufficient documentation

## 2023-07-09 DIAGNOSIS — Z3A32 32 weeks gestation of pregnancy: Secondary | ICD-10-CM

## 2023-07-09 DIAGNOSIS — O09522 Supervision of elderly multigravida, second trimester: Secondary | ICD-10-CM | POA: Diagnosis not present

## 2023-07-09 DIAGNOSIS — O30032 Twin pregnancy, monochorionic/diamniotic, second trimester: Secondary | ICD-10-CM | POA: Insufficient documentation

## 2023-07-09 DIAGNOSIS — O35BXX1 Maternal care for other (suspected) fetal abnormality and damage, fetal cardiac anomalies, fetus 1: Secondary | ICD-10-CM

## 2023-07-12 DIAGNOSIS — O30033 Twin pregnancy, monochorionic/diamniotic, third trimester: Secondary | ICD-10-CM | POA: Diagnosis not present

## 2023-07-12 DIAGNOSIS — Z23 Encounter for immunization: Secondary | ICD-10-CM | POA: Diagnosis not present

## 2023-07-16 NOTE — Patient Instructions (Signed)
Carolyn Bush  07/16/2023   Your procedure is scheduled on:  07/31/2023  Arrive at 1015 at Entrance C on [address] at [facility]  and [employer]. You are invited to use the FREE valet parking or use the Visitor's parking deck.  Pick up the phone at the desk and dial [phone].  Call this number if you have problems the morning of surgery: [phone]  Remember:   Do not eat food:(After Midnight) Desps de medianoche.  Do not drink clear liquids: (After Midnight) Desps de medianoche.  Take these medicines the morning of surgery with A SIP OF WATER:  none   Do not wear jewelry, make-up or nail polish.  Do not wear lotions, powders, or perfumes. Do not wear deodorant.  Do not shave 48 hours prior to surgery.  Do not bring valuables to the hospital.  [facility] is not   responsible for any belongings or valuables brought to the hospital.  Contacts, dentures or bridgework may not be worn into surgery.  Leave suitcase in the car. After surgery it may be brought to your room.  For patients admitted to the hospital, checkout time is 11:00 AM the day of              discharge.      Please read over the following fact sheets that you were given:     Preparing for Surgery

## 2023-07-17 ENCOUNTER — Other Ambulatory Visit: Payer: Self-pay | Admitting: Obstetrics and Gynecology

## 2023-07-17 ENCOUNTER — Ambulatory Visit: Payer: 59 | Attending: Obstetrics and Gynecology

## 2023-07-17 ENCOUNTER — Ambulatory Visit: Payer: 59

## 2023-07-17 ENCOUNTER — Encounter (HOSPITAL_COMMUNITY): Payer: Self-pay

## 2023-07-17 ENCOUNTER — Other Ambulatory Visit: Payer: Self-pay

## 2023-07-17 VITALS — BP 107/60 | HR 81

## 2023-07-17 DIAGNOSIS — O99013 Anemia complicating pregnancy, third trimester: Secondary | ICD-10-CM | POA: Diagnosis not present

## 2023-07-17 DIAGNOSIS — O35BXX1 Maternal care for other (suspected) fetal abnormality and damage, fetal cardiac anomalies, fetus 1: Secondary | ICD-10-CM

## 2023-07-17 DIAGNOSIS — O34219 Maternal care for unspecified type scar from previous cesarean delivery: Secondary | ICD-10-CM

## 2023-07-17 DIAGNOSIS — O30033 Twin pregnancy, monochorionic/diamniotic, third trimester: Secondary | ICD-10-CM

## 2023-07-17 DIAGNOSIS — O09523 Supervision of elderly multigravida, third trimester: Secondary | ICD-10-CM

## 2023-07-17 DIAGNOSIS — D573 Sickle-cell trait: Secondary | ICD-10-CM | POA: Diagnosis not present

## 2023-07-17 DIAGNOSIS — Z3A35 35 weeks gestation of pregnancy: Secondary | ICD-10-CM | POA: Diagnosis not present

## 2023-07-17 MED ORDER — ABRYSVO 120 MCG/0.5ML IM SOLR
0.5000 mL | Freq: Once | INTRAMUSCULAR | 0 refills | Status: AC
  Filled 2023-07-17: qty 1, 1d supply, fill #0

## 2023-07-22 DIAGNOSIS — Z3490 Encounter for supervision of normal pregnancy, unspecified, unspecified trimester: Secondary | ICD-10-CM | POA: Diagnosis not present

## 2023-07-24 ENCOUNTER — Ambulatory Visit: Payer: 59 | Admitting: *Deleted

## 2023-07-24 ENCOUNTER — Ambulatory Visit: Payer: 59 | Attending: Obstetrics and Gynecology

## 2023-07-24 VITALS — BP 99/47 | HR 78

## 2023-07-24 DIAGNOSIS — D573 Sickle-cell trait: Secondary | ICD-10-CM

## 2023-07-24 DIAGNOSIS — O09523 Supervision of elderly multigravida, third trimester: Secondary | ICD-10-CM | POA: Diagnosis not present

## 2023-07-24 DIAGNOSIS — O99013 Anemia complicating pregnancy, third trimester: Secondary | ICD-10-CM | POA: Diagnosis not present

## 2023-07-24 DIAGNOSIS — O35BXX1 Maternal care for other (suspected) fetal abnormality and damage, fetal cardiac anomalies, fetus 1: Secondary | ICD-10-CM | POA: Insufficient documentation

## 2023-07-24 DIAGNOSIS — O30033 Twin pregnancy, monochorionic/diamniotic, third trimester: Secondary | ICD-10-CM | POA: Diagnosis not present

## 2023-07-24 DIAGNOSIS — Z3A36 36 weeks gestation of pregnancy: Secondary | ICD-10-CM

## 2023-07-24 DIAGNOSIS — O34219 Maternal care for unspecified type scar from previous cesarean delivery: Secondary | ICD-10-CM | POA: Diagnosis not present

## 2023-07-26 ENCOUNTER — Other Ambulatory Visit: Payer: Self-pay | Admitting: Obstetrics and Gynecology

## 2023-07-26 DIAGNOSIS — O34219 Maternal care for unspecified type scar from previous cesarean delivery: Secondary | ICD-10-CM

## 2023-07-29 ENCOUNTER — Encounter (HOSPITAL_COMMUNITY)
Admission: RE | Admit: 2023-07-29 | Discharge: 2023-07-29 | Disposition: A | Discharge status: Home / Self Care | Payer: 59 | Source: Ambulatory Visit | Attending: Obstetrics and Gynecology | Admitting: Obstetrics and Gynecology

## 2023-07-29 DIAGNOSIS — Z01812 Encounter for preprocedural laboratory examination: Secondary | ICD-10-CM | POA: Insufficient documentation

## 2023-07-29 DIAGNOSIS — Z3A00 Weeks of gestation of pregnancy not specified: Secondary | ICD-10-CM | POA: Insufficient documentation

## 2023-07-29 DIAGNOSIS — O34219 Maternal care for unspecified type scar from previous cesarean delivery: Secondary | ICD-10-CM | POA: Insufficient documentation

## 2023-07-29 LAB — CBC
HCT: 36.7 % (ref 36.0–46.0)
Hemoglobin: 12.5 g/dL (ref 12.0–15.0)
MCH: 32.6 pg (ref 26.0–34.0)
MCHC: 34.1 g/dL (ref 30.0–36.0)
MCV: 95.8 fL (ref 80.0–100.0)
Platelets: 225 10*3/uL (ref 150–400)
RBC: 3.83 MIL/uL — ABNORMAL LOW (ref 3.87–5.11)
RDW: 14.4 % (ref 11.5–15.5)
WBC: 7.6 10*3/uL (ref 4.0–10.5)
nRBC: 0.3 % — ABNORMAL HIGH (ref 0.0–0.2)

## 2023-07-29 LAB — TYPE AND SCREEN
ABO/RH(D): O POS
Antibody Screen: NEGATIVE

## 2023-07-30 LAB — RPR: RPR Ser Ql: NONREACTIVE

## 2023-07-31 ENCOUNTER — Encounter (HOSPITAL_COMMUNITY)
Admission: RE | Disposition: A | Discharge status: Home / Self Care | Payer: Self-pay | Source: Home / Self Care | Attending: Obstetrics and Gynecology

## 2023-07-31 ENCOUNTER — Inpatient Hospital Stay (HOSPITAL_COMMUNITY): Payer: Self-pay | Admitting: Anesthesiology

## 2023-07-31 ENCOUNTER — Other Ambulatory Visit: Payer: Self-pay

## 2023-07-31 ENCOUNTER — Inpatient Hospital Stay (HOSPITAL_COMMUNITY)
Admission: RE | Admit: 2023-07-31 | Discharge: 2023-08-02 | DRG: 785 | Disposition: A | Discharge status: Home / Self Care | Payer: 59 | Attending: Obstetrics and Gynecology | Admitting: Obstetrics and Gynecology

## 2023-07-31 ENCOUNTER — Encounter (HOSPITAL_COMMUNITY): Payer: Self-pay | Admitting: Obstetrics and Gynecology

## 2023-07-31 ENCOUNTER — Other Ambulatory Visit: Payer: Self-pay | Admitting: Obstetrics and Gynecology

## 2023-07-31 DIAGNOSIS — Z3A37 37 weeks gestation of pregnancy: Secondary | ICD-10-CM | POA: Diagnosis not present

## 2023-07-31 DIAGNOSIS — O34211 Maternal care for low transverse scar from previous cesarean delivery: Principal | ICD-10-CM | POA: Diagnosis present

## 2023-07-31 DIAGNOSIS — O30033 Twin pregnancy, monochorionic/diamniotic, third trimester: Secondary | ICD-10-CM | POA: Diagnosis not present

## 2023-07-31 DIAGNOSIS — Z3A00 Weeks of gestation of pregnancy not specified: Secondary | ICD-10-CM | POA: Diagnosis not present

## 2023-07-31 DIAGNOSIS — Z302 Encounter for sterilization: Secondary | ICD-10-CM

## 2023-07-31 DIAGNOSIS — Z98891 History of uterine scar from previous surgery: Principal | ICD-10-CM

## 2023-07-31 DIAGNOSIS — O34219 Maternal care for unspecified type scar from previous cesarean delivery: Principal | ICD-10-CM | POA: Diagnosis present

## 2023-07-31 DIAGNOSIS — O30032 Twin pregnancy, monochorionic/diamniotic, second trimester: Secondary | ICD-10-CM | POA: Diagnosis not present

## 2023-07-31 SURGERY — Surgical Case
Anesthesia: Spinal | Site: Abdomen

## 2023-07-31 MED ORDER — MORPHINE SULFATE (PF) 0.5 MG/ML IJ SOLN
INTRAMUSCULAR | Status: DC | PRN
  Administered 2023-07-31: 150 ug via INTRATHECAL

## 2023-07-31 MED ORDER — SODIUM CHLORIDE 0.9 % IR SOLN
Status: DC | PRN
  Administered 2023-07-31: 1000 mL

## 2023-07-31 MED ORDER — POVIDONE-IODINE 10 % EX SWAB
2.0000 | Freq: Once | CUTANEOUS | Status: AC
  Administered 2023-07-31: 2 via TOPICAL

## 2023-07-31 MED ORDER — SODIUM CHLORIDE 0.9 % IV SOLN
INTRAVENOUS | Status: DC | PRN
Start: 2023-07-31 — End: 2023-07-31

## 2023-07-31 MED ORDER — CEFAZOLIN SODIUM-DEXTROSE 2-4 GM/100ML-% IV SOLN
INTRAVENOUS | Status: AC
  Filled 2023-07-31: qty 100

## 2023-07-31 MED ORDER — ONDANSETRON HCL 4 MG/2ML IJ SOLN
4.0000 mg | Freq: Once | INTRAMUSCULAR | Status: AC
  Administered 2023-07-31: 4 mg via INTRAVENOUS

## 2023-07-31 MED ORDER — TRANEXAMIC ACID-NACL 1000-0.7 MG/100ML-% IV SOLN
INTRAVENOUS | Status: AC
  Filled 2023-07-31: qty 100

## 2023-07-31 MED ORDER — DIPHENHYDRAMINE HCL 25 MG PO CAPS
25.0000 mg | ORAL_CAPSULE | Freq: Four times a day (QID) | ORAL | Status: DC | PRN
  Administered 2023-08-01 (×3): 25 mg via ORAL
  Filled 2023-07-31 (×3): qty 1

## 2023-07-31 MED ORDER — SIMETHICONE 80 MG PO CHEW: 80.0000 mg | CHEWABLE_TABLET | ORAL | Status: DC | PRN

## 2023-07-31 MED ORDER — SOD CITRATE-CITRIC ACID 500-334 MG/5ML PO SOLN
ORAL | Status: AC
  Filled 2023-07-31: qty 30

## 2023-07-31 MED ORDER — BUPIVACAINE IN DEXTROSE 0.75-8.25 % IT SOLN
INTRATHECAL | Status: DC | PRN
  Administered 2023-07-31: 1.6 mL via INTRATHECAL

## 2023-07-31 MED ORDER — DEXAMETHASONE SODIUM PHOSPHATE 10 MG/ML IJ SOLN
INTRAMUSCULAR | Status: DC | PRN
  Administered 2023-07-31: 10 mg via INTRAVENOUS

## 2023-07-31 MED ORDER — KETOROLAC TROMETHAMINE 30 MG/ML IJ SOLN
30.0000 mg | Freq: Four times a day (QID) | INTRAMUSCULAR | Status: AC
  Administered 2023-07-31 – 2023-08-01 (×4): 30 mg via INTRAVENOUS
  Filled 2023-07-31 (×4): qty 1

## 2023-07-31 MED ORDER — BUPIVACAINE HCL (PF) 0.5 % IJ SOLN
INTRAMUSCULAR | Status: AC
  Filled 2023-07-31: qty 30

## 2023-07-31 MED ORDER — HYDROMORPHONE HCL 1 MG/ML IJ SOLN: 0.2000 mg | INTRAMUSCULAR | Status: DC | PRN

## 2023-07-31 MED ORDER — ACETAMINOPHEN 10 MG/ML IV SOLN
INTRAVENOUS | Status: AC
  Filled 2023-07-31: qty 100

## 2023-07-31 MED ORDER — DIBUCAINE (PERIANAL) 1 % EX OINT: 1.0000 | TOPICAL_OINTMENT | CUTANEOUS | Status: DC | PRN

## 2023-07-31 MED ORDER — IBUPROFEN 600 MG PO TABS
600.0000 mg | ORAL_TABLET | Freq: Four times a day (QID) | ORAL | Status: DC
  Administered 2023-08-01 – 2023-08-02 (×4): 600 mg via ORAL
  Filled 2023-07-31 (×4): qty 1

## 2023-07-31 MED ORDER — SENNOSIDES-DOCUSATE SODIUM 8.6-50 MG PO TABS
2.0000 | ORAL_TABLET | Freq: Every day | ORAL | Status: DC
  Administered 2023-08-01 – 2023-08-02 (×2): 2 via ORAL
  Filled 2023-07-31 (×2): qty 2

## 2023-07-31 MED ORDER — PHENYLEPHRINE HCL-NACL 20-0.9 MG/250ML-% IV SOLN
INTRAVENOUS | Status: DC | PRN
  Administered 2023-07-31: 60 ug/min via INTRAVENOUS

## 2023-07-31 MED ORDER — OXYTOCIN-SODIUM CHLORIDE 30-0.9 UT/500ML-% IV SOLN
INTRAVENOUS | Status: AC
  Filled 2023-07-31: qty 500

## 2023-07-31 MED ORDER — MEPERIDINE HCL 25 MG/ML IJ SOLN: 6.2500 mg | INTRAMUSCULAR | Status: DC | PRN

## 2023-07-31 MED ORDER — ACETAMINOPHEN 500 MG PO TABS
1000.0000 mg | ORAL_TABLET | Freq: Four times a day (QID) | ORAL | Status: DC
  Administered 2023-07-31 – 2023-08-02 (×8): 1000 mg via ORAL
  Filled 2023-07-31 (×8): qty 2

## 2023-07-31 MED ORDER — OXYTOCIN-SODIUM CHLORIDE 30-0.9 UT/500ML-% IV SOLN
INTRAVENOUS | Status: DC | PRN
  Administered 2023-07-31: 300 mL via INTRAVENOUS

## 2023-07-31 MED ORDER — ONDANSETRON HCL 4 MG/2ML IJ SOLN
INTRAMUSCULAR | Status: AC
  Filled 2023-07-31: qty 2

## 2023-07-31 MED ORDER — FERROUS SULFATE 325 (65 FE) MG PO TABS
325.0000 mg | ORAL_TABLET | Freq: Two times a day (BID) | ORAL | Status: DC
  Administered 2023-08-01 – 2023-08-02 (×3): 325 mg via ORAL
  Filled 2023-07-31 (×3): qty 1

## 2023-07-31 MED ORDER — BUPIVACAINE HCL (PF) 0.5 % IJ SOLN
INTRAMUSCULAR | Status: DC | PRN
  Administered 2023-07-31: 30 mL

## 2023-07-31 MED ORDER — FENTANYL CITRATE (PF) 100 MCG/2ML IJ SOLN: 25.0000 ug | INTRAMUSCULAR | Status: DC | PRN

## 2023-07-31 MED ORDER — CEFAZOLIN SODIUM-DEXTROSE 2-4 GM/100ML-% IV SOLN
2.0000 g | INTRAVENOUS | Status: AC
  Administered 2023-07-31: 2 g via INTRAVENOUS

## 2023-07-31 MED ORDER — PHENYLEPHRINE 80 MCG/ML (10ML) SYRINGE FOR IV PUSH (FOR BLOOD PRESSURE SUPPORT): PREFILLED_SYRINGE | INTRAVENOUS | Status: DC | PRN

## 2023-07-31 MED ORDER — OXYTOCIN-SODIUM CHLORIDE 30-0.9 UT/500ML-% IV SOLN: 2.5000 [IU]/h | INTRAVENOUS | Status: AC

## 2023-07-31 MED ORDER — SIMETHICONE 80 MG PO CHEW
80.0000 mg | CHEWABLE_TABLET | Freq: Three times a day (TID) | ORAL | Status: DC
  Administered 2023-08-01 – 2023-08-02 (×5): 80 mg via ORAL
  Filled 2023-07-31 (×5): qty 1

## 2023-07-31 MED ORDER — ZOLPIDEM TARTRATE 5 MG PO TABS: 5.0000 mg | ORAL_TABLET | Freq: Every evening | ORAL | Status: DC | PRN

## 2023-07-31 MED ORDER — STERILE WATER FOR IRRIGATION IR SOLN
Status: DC | PRN
  Administered 2023-07-31: 1000 mL

## 2023-07-31 MED ORDER — BUPIVACAINE LIPOSOME 1.3 % IJ SUSP
INTRAMUSCULAR | Status: DC | PRN
  Administered 2023-07-31: 20 mL

## 2023-07-31 MED ORDER — GLYCOPYRROLATE PF 0.2 MG/ML IJ SOSY
PREFILLED_SYRINGE | INTRAMUSCULAR | Status: AC
  Filled 2023-07-31: qty 1

## 2023-07-31 MED ORDER — METHYLERGONOVINE MALEATE 0.2 MG PO TABS: 0.2000 mg | ORAL_TABLET | ORAL | Status: DC | PRN

## 2023-07-31 MED ORDER — LACTATED RINGERS IV SOLN: INTRAVENOUS | Status: DC

## 2023-07-31 MED ORDER — MENTHOL 3 MG MT LOZG: 1.0000 | LOZENGE | OROMUCOSAL | Status: DC | PRN

## 2023-07-31 MED ORDER — MORPHINE SULFATE (PF) 0.5 MG/ML IJ SOLN
INTRAMUSCULAR | Status: AC
  Filled 2023-07-31: qty 10

## 2023-07-31 MED ORDER — MAGNESIUM HYDROXIDE 400 MG/5ML PO SUSP: 30.0000 mL | ORAL | Status: DC | PRN

## 2023-07-31 MED ORDER — EPHEDRINE SULFATE-NACL 50-0.9 MG/10ML-% IV SOSY
PREFILLED_SYRINGE | INTRAVENOUS | Status: DC | PRN
  Administered 2023-07-31 (×2): 5 mg via INTRAVENOUS

## 2023-07-31 MED ORDER — PRENATAL MULTIVITAMIN CH
1.0000 | ORAL_TABLET | Freq: Every day | ORAL | Status: DC
  Administered 2023-08-01 – 2023-08-02 (×2): 1 via ORAL
  Filled 2023-07-31 (×2): qty 1

## 2023-07-31 MED ORDER — METHYLERGONOVINE MALEATE 0.2 MG/ML IJ SOLN: 0.2000 mg | INTRAMUSCULAR | Status: DC | PRN

## 2023-07-31 MED ORDER — FENTANYL CITRATE (PF) 100 MCG/2ML IJ SOLN
INTRAMUSCULAR | Status: DC | PRN
  Administered 2023-07-31: 15 ug via INTRATHECAL

## 2023-07-31 MED ORDER — WITCH HAZEL-GLYCERIN EX PADS: 1.0000 | MEDICATED_PAD | CUTANEOUS | Status: DC | PRN

## 2023-07-31 MED ORDER — COCONUT OIL OIL
1.0000 | TOPICAL_OIL | Status: DC | PRN
  Administered 2023-08-01: 1 via TOPICAL

## 2023-07-31 MED ORDER — ACETAMINOPHEN 500 MG PO TABS: 1000.0000 mg | ORAL_TABLET | ORAL | Status: DC

## 2023-07-31 MED ORDER — PHENYLEPHRINE HCL-NACL 20-0.9 MG/250ML-% IV SOLN
INTRAVENOUS | Status: AC
  Filled 2023-07-31: qty 250

## 2023-07-31 MED ORDER — BUPIVACAINE LIPOSOME 1.3 % IJ SUSP
INTRAMUSCULAR | Status: AC
  Filled 2023-07-31: qty 20

## 2023-07-31 MED ORDER — FENTANYL CITRATE (PF) 100 MCG/2ML IJ SOLN
INTRAMUSCULAR | Status: AC
  Filled 2023-07-31: qty 2

## 2023-07-31 MED ORDER — ACETAMINOPHEN 10 MG/ML IV SOLN
INTRAVENOUS | Status: DC | PRN
Start: 2023-07-31 — End: 2023-07-31
  Administered 2023-07-31: 1000 mg via INTRAVENOUS

## 2023-07-31 MED ORDER — SOD CITRATE-CITRIC ACID 500-334 MG/5ML PO SOLN: 30.0000 mL | ORAL | Status: DC

## 2023-07-31 MED ORDER — ONDANSETRON HCL 4 MG/2ML IJ SOLN
INTRAMUSCULAR | Status: DC | PRN
  Administered 2023-07-31: 4 mg via INTRAVENOUS

## 2023-07-31 MED ORDER — TRANEXAMIC ACID-NACL 1000-0.7 MG/100ML-% IV SOLN
1000.0000 mg | Freq: Once | INTRAVENOUS | Status: AC
  Administered 2023-07-31: 1000 mg via INTRAVENOUS

## 2023-07-31 MED ORDER — OXYCODONE HCL 5 MG PO TABS
5.0000 mg | ORAL_TABLET | ORAL | Status: DC | PRN
  Administered 2023-08-02 (×3): 5 mg via ORAL
  Filled 2023-07-31 (×4): qty 1

## 2023-07-31 SURGICAL SUPPLY — 43 items
APL PRP STRL LF DISP 70% ISPRP (MISCELLANEOUS) ×4
APL SKNCLS STERI-STRIP NONHPOA (GAUZE/BANDAGES/DRESSINGS) ×2
BARRIER ADHS 3X4 INTERCEED (GAUZE/BANDAGES/DRESSINGS) IMPLANT
BENZOIN TINCTURE PRP APPL 2/3 (GAUZE/BANDAGES/DRESSINGS) ×2 IMPLANT
BINDER ABDOMINAL 10 UNV 27-48 (MISCELLANEOUS) IMPLANT
BINDER ABDOMINAL 12 ML 46-62 (SOFTGOODS) IMPLANT
BRR ADH 4X3 ABS CNTRL BYND (GAUZE/BANDAGES/DRESSINGS) ×2
CHLORAPREP W/TINT 26 (MISCELLANEOUS) ×4 IMPLANT
CLAMP UMBILICAL CORD (MISCELLANEOUS) ×2 IMPLANT
CLOTH BEACON ORANGE TIMEOUT ST (SAFETY) ×2 IMPLANT
DRSG OPSITE POSTOP 4X10 (GAUZE/BANDAGES/DRESSINGS) ×2 IMPLANT
ELECT REM PT RETURN 9FT ADLT (ELECTROSURGICAL) ×2
ELECTRODE REM PT RTRN 9FT ADLT (ELECTROSURGICAL) ×2 IMPLANT
EXTRACTOR VACUUM KIWI (MISCELLANEOUS) IMPLANT
GAUZE PAD ABD 8X10 STRL (GAUZE/BANDAGES/DRESSINGS) IMPLANT
GAUZE SPONGE 4X4 12PLY STRL LF (GAUZE/BANDAGES/DRESSINGS) IMPLANT
GLOVE BIOGEL M 7.0 STRL (GLOVE) ×4 IMPLANT
GLOVE BIOGEL PI IND STRL 7.0 (GLOVE) ×6 IMPLANT
GOWN STRL REUS W/TWL LRG LVL3 (GOWN DISPOSABLE) ×6 IMPLANT
KIT ABG SYR 3ML LUER SLIP (SYRINGE) IMPLANT
LIGASURE IMPACT 36 18CM CVD LR (INSTRUMENTS) IMPLANT
NDL HYPO 25X5/8 SAFETYGLIDE (NEEDLE) IMPLANT
NEEDLE HYPO 25X5/8 SAFETYGLIDE (NEEDLE) IMPLANT
NS IRRIG 1000ML POUR BTL (IV SOLUTION) ×2 IMPLANT
PACK C SECTION WH (CUSTOM PROCEDURE TRAY) ×2 IMPLANT
PAD OB MATERNITY 4.3X12.25 (PERSONAL CARE ITEMS) ×2 IMPLANT
RETRACTOR TRAXI PANNICULUS (MISCELLANEOUS) IMPLANT
RTRCTR C-SECT PINK 25CM LRG (MISCELLANEOUS) ×2 IMPLANT
STRIP CLOSURE SKIN 1/2X4 (GAUZE/BANDAGES/DRESSINGS) ×2 IMPLANT
SUT MNCRL 0 VIOLET CTX 36 (SUTURE) ×4 IMPLANT
SUT MON AB 4-0 PS1 27 (SUTURE) ×2 IMPLANT
SUT PDS AB 0 CT1 27 (SUTURE) ×4 IMPLANT
SUT PLAIN 0 NONE (SUTURE) IMPLANT
SUT VIC AB 2-0 CT1 (SUTURE) IMPLANT
SUT VIC AB 2-0 CT1 27 (SUTURE) ×2
SUT VIC AB 2-0 CT1 TAPERPNT 27 (SUTURE) ×2 IMPLANT
SUT VIC AB 3-0 SH 27 (SUTURE)
SUT VIC AB 3-0 SH 27X BRD (SUTURE) IMPLANT
TAPE PAPER 1X10 WHT MICROPORE (GAUZE/BANDAGES/DRESSINGS) IMPLANT
TOWEL OR 17X24 6PK STRL BLUE (TOWEL DISPOSABLE) ×2 IMPLANT
TRAY FOLEY W/BAG SLVR 14FR LF (SET/KITS/TRAYS/PACK) ×2 IMPLANT
WATER STERILE IRR 1000ML POUR (IV SOLUTION) ×2 IMPLANT
YANKAUER SUCT BULB TIP NO VENT (SUCTIONS) IMPLANT

## 2023-07-31 NOTE — Op Note (Signed)
Cesarean Section  and Bilateral salpingectomy Procedure Note  Indications:  Current monochorionic/ diamniotic twin pregnancy and history of cesarean section x 2. Patient desires repeat cesarean section and permanent sterilization   Pre-operative Diagnosis: 37 week 1 day pregnancy.  Post-operative Diagnosis: same  Surgeon: Gerald Leitz M.D  Assistants:  Dr. Steva Ready assisted.  An experienced assistant was required given the standard of surgical care given the complexity of the case.  This assistant was needed for exposure, dissection, suctioning, retraction, instrument exchange, assisting with delivery with administration of fundal pressure, and for overall help during the procedure.   Anesthesia: Spinal anesthesia  ASA Class: 2   Procedure Details   The patient was seen in the Holding Room. The risks, benefits, complications, treatment options, and expected outcomes were discussed with the patient.  The patient concurred with the proposed plan, giving informed consent.  The site of surgery properly noted/marked. The patient was taken to Operating Room # A, identified as Carolyn Bush and the procedure verified as C-Section Delivery. A Time Out was held and the above information confirmed.  After induction of anesthesia, the patient was draped and prepped in the usual sterile manner. A Pfannenstiel incision was made and carried down through the subcutaneous tissue to the fascia. Fascial incision was made and extended transversely. The fascia was separated from the underlying rectus tissue superiorly and inferiorly. The peritoneum was identified and entered. Peritoneal incision was extended longitudinally. The utero-vesical peritoneal reflection was incised transversely and the bladder flap was bluntly freed from the lower uterine segment. A low transverse uterine incision was made.Baby A  Delivered from cephalic presentation was a 2960 gram Female with Apgar scores of 9 at one minute and 9  at five minutes.  Baby B was delivered from a cephalic presentation 2890 gram female with apgar scores of 9 at one minute and 9 at five minutes. . After the umbilical cord was clamped and cut cord blood was obtained for evaluation. The placenta was removed intact and appeared normal. The uterine outline, tubes and ovaries appeared normal. The uterine incision was closed with running locked sutures of  0 monocryl . Hemostasis was observed. Lavage was carried out until clear. Attention was turned to the left fallopian tube which was grasped with a babcock clamp. The left fallopian tube was cauterized and excised along the mesosalpinx to the cornua of the uterus. This was repeated on the right fallopian tube. Interseed was placed along the uterine incision.  The fascia was then reapproximated with running sutures of  0 pds . The skin was reapproximated with  4-0 monocryl.  .  Instrument, sponge, and needle counts were correct prior the abdominal closure and at the conclusion of the case.   Findings: Clear amniotic fluid. Diamniotic monochroionic twin pregnancy. Baby A was female and in the cephalic presentation. Baby B was female and in the cephalic presentation. Normal appearing fallopian tubes and ovaries were noted.   Estimated Blood Loss:   650.Marland Kitchen 225 cc of blood was returned due to cell saver          Drains: None         Total IV Fluids:  per anesthesia ml         Specimens: Placenta, Fallopian Tube, and Disposition:  Sent to Pathology          Implants: None         Complications:  None; patient tolerated the procedure well.  Disposition: PACU - hemodynamically stable.         Condition: stable  Attending Attestation: I performed the procedure.

## 2023-07-31 NOTE — Anesthesia Preprocedure Evaluation (Signed)
Anesthesia Evaluation  Patient identified by MRN, date of birth, ID band Patient awake    Reviewed: Allergy & Precautions, NPO status , Patient's Chart, lab work & pertinent test results  Airway Mallampati: II  TM Distance: >3 FB Neck ROM: Full    Dental  (+) Dental Advisory Given   Pulmonary asthma    breath sounds clear to auscultation       Cardiovascular negative cardio ROS  Rhythm:Regular Rate:Normal     Neuro/Psych negative neurological ROS     GI/Hepatic negative GI ROS, Neg liver ROS,,,  Endo/Other  negative endocrine ROS    Renal/GU negative Renal ROS     Musculoskeletal   Abdominal   Peds  Hematology negative hematology ROS (+)   Anesthesia Other Findings   Reproductive/Obstetrics (+) Pregnancy                             Anesthesia Physical Anesthesia Plan  ASA: 2  Anesthesia Plan: Spinal   Post-op Pain Management:    Induction: Intravenous  PONV Risk Score and Plan: 2 and Dexamethasone, Ondansetron and Treatment may vary due to age or medical condition  Airway Management Planned: Natural Airway  Additional Equipment:   Intra-op Plan:   Post-operative Plan:   Informed Consent: I have reviewed the patients History and Physical, chart, labs and discussed the procedure including the risks, benefits and alternatives for the proposed anesthesia with the patient or authorized representative who has indicated his/her understanding and acceptance.       Plan Discussed with:   Anesthesia Plan Comments:        Anesthesia Quick Evaluation

## 2023-07-31 NOTE — Anesthesia Procedure Notes (Signed)
Spinal  Patient location during procedure: OR Start time: 07/31/2023 12:21 PM End time: 07/31/2023 12:26 PM Reason for block: surgical anesthesia Staffing Anesthesiologist: Marcene Duos, MD Performed by: Marcene Duos, MD Authorized by: Marcene Duos, MD   Preanesthetic Checklist Completed: patient identified, IV checked, site marked, risks and benefits discussed, surgical consent, monitors and equipment checked, pre-op evaluation and timeout performed Spinal Block Patient position: sitting Prep: DuraPrep Patient monitoring: heart rate, cardiac monitor, continuous pulse ox and blood pressure Approach: midline Location: L4-5 Injection technique: single-shot Needle Needle type: Pencan  Needle gauge: 24 G Needle length: 9 cm Assessment Sensory level: T4 Events: CSF return

## 2023-07-31 NOTE — H&P (Deleted)
  The note originally documented on this encounter has been moved the the encounter in which it belongs.  

## 2023-07-31 NOTE — H&P (Signed)
Date of Initial H&P: 07/30/2024  History reviewed, patient examined, no change in status, stable for surgery.

## 2023-07-31 NOTE — Transfer of Care (Signed)
Immediate Anesthesia Transfer of Care Note  Patient: Carolyn Bush  Procedure(s) Performed: REPEAT CESAREAN SECTION MULTI-GESTATIONAL WITH BILATERAL SALPINGECTOMY (Bilateral: Abdomen) APPLICATION OF CELL SAVER (Abdomen)  Patient Location: PACU  Anesthesia Type:Spinal  Level of Consciousness: awake  Airway & Oxygen Therapy: Patient Spontanous Breathing  Post-op Assessment: Report given to RN and Post -op Vital signs reviewed and stable  Post vital signs: Reviewed and stable  Last Vitals:  Vitals Value Taken Time  BP 96/64 07/31/23 1407  Temp    Pulse 122 07/31/23 1409  Resp 16 07/31/23 1409  SpO2 86 % 07/31/23 1409  Vitals shown include unfiled device data.  Last Pain:  Vitals:   07/31/23 1041  TempSrc: Oral         Complications: No notable events documented.

## 2023-07-31 NOTE — H&P (Signed)
Carolyn Bush is a 42 y.o. female G3P2002 at 37 weeks and 1 day presenting for repeat cesarean section and bilateral salpingectomy. She has monochorionic diamniotic twin pregnancy. Her pregnancy is also complicated by history of Cesarean section x 2.   OB History     Gravida  3   Para  2   Term  2   Preterm  0   AB  0   Living  2      SAB  0   IAB  0   Ectopic  0   Multiple  0   Live Births  2          Past Medical History:  Diagnosis Date   Asthma    COVID-19    X 4   History of chicken pox    Sickle cell trait (HCC)    Past Surgical History:  Procedure Laterality Date   BUNIONECTOMY     CESAREAN SECTION  02/20/2012   Procedure: CESAREAN SECTION;  Surgeon: Dorien Chihuahua. Richardson Dopp, MD;  Location: [facility];  Service: Gynecology;  Laterality: N/A;   CESAREAN SECTION N/A 01/02/2018   Procedure: CESAREAN SECTION;  Surgeon: Geryl Rankins, MD;  Location: [facility]IRTHING SUIT[facility];  Service: Obstetrics;  Laterality: N/A;   SCAR REVISION     WISDOM TOOTH EXTRACTION     Family History: family history includes Birth defects in her brother; Cancer in her mother; Diabetes in her maternal grandfather; Prostate cancer in her paternal grandfather; Sarcoidosis in her maternal grandfather; Seizures in her brother; Thyroid disease in her maternal grandmother. Social History:  reports that she has never smoked. She has never used smokeless tobacco. She reports that she does not currently use alcohol. She reports that she does not use drugs.     Maternal Diabetes: No Genetic Screening: Declined Maternal Ultrasounds/Referrals: Normal Fetal Ultrasounds or other Referrals:  None Maternal Substance Abuse:  No Significant Maternal Medications:  None Significant Maternal Lab Results:  Group B Strep positive Number of Prenatal Visits:greater than 3 verified prenatal visits Maternal Vaccinations:TDap Other Comments:  None  Review of Systems  Constitutional: Negative.   HENT: Negative.     Eyes: Negative.   Respiratory: Negative.    Cardiovascular: Negative.   Gastrointestinal: Negative.   Endocrine: Negative.   Genitourinary: Negative.   Musculoskeletal: Negative.   Skin: Negative.   Allergic/Immunologic: Negative.   Neurological: Negative.   Hematological: Negative.   Psychiatric/Behavioral: Negative.     History   Last menstrual period 11/13/2022, currently breastfeeding. Maternal Exam:  Introitus: Normal vulva.   Physical Exam Vitals reviewed.  Constitutional:      Appearance: Normal appearance.  HENT:     Head: Normocephalic and atraumatic.     Nose: Nose normal.     Mouth/Throat:     Mouth: Mucous membranes are moist.  Cardiovascular:     Rate and Rhythm: Normal rate and regular rhythm.     Pulses: Normal pulses.     Heart sounds: Normal heart sounds.  Pulmonary:     Effort: Pulmonary effort is normal.     Breath sounds: Normal breath sounds.  Abdominal:     Tenderness: There is no abdominal tenderness.  Genitourinary:    General: Normal vulva.  Musculoskeletal:        General: Normal range of motion.     Cervical back: Normal range of motion and neck supple.  Skin:    General: Skin is warm and dry.  Neurological:     General: No focal  deficit present.     Mental Status: She is alert and oriented to person, place, and time.  Psychiatric:        Mood and Affect: Mood normal.        Behavior: Behavior normal.     Prenatal labs: ABO, Rh: --/--/O POS (09/23 1111) Antibody: NEG (09/23 1111) Rubella: Immune (03/06 0000) RPR: NON REACTIVE (09/23 1105)  HBsAg: Negative (03/06 0000)  HIV: Non-reactive (03/06 0000)  GBS:   Positive   Assessment/Plan: 37 weeks and 1 day with monochorionic/ diamniotic twin pregnancy and history of cesarean section x 2.  Planning repeat cesarean section and bilateral salpingectomy. R/B/A of cesarean section discussed with the patient including but not limited to infection, bleeding damage to bowel bladder and  baby with the need for further surgery. R/O transfusion HIV/ Hep B&C discussed. Pt voiced understanding and desires to proceed with cesarean section.  Patient desires permanent sterilization and desires bilateral salpingectomy at the time of repeat cesarean section.    Gerald Leitz 07/31/2023, 9:53 AM

## 2023-08-01 ENCOUNTER — Encounter (HOSPITAL_COMMUNITY): Payer: Self-pay | Admitting: Obstetrics and Gynecology

## 2023-08-01 LAB — CBC
HCT: 36 % (ref 36.0–46.0)
Hemoglobin: 12.1 g/dL (ref 12.0–15.0)
MCH: 30.9 pg (ref 26.0–34.0)
MCHC: 33.6 g/dL (ref 30.0–36.0)
MCV: 92.1 fL (ref 80.0–100.0)
Platelets: 230 10*3/uL (ref 150–400)
RBC: 3.91 MIL/uL (ref 3.87–5.11)
RDW: 13.8 % (ref 11.5–15.5)
WBC: 12.1 10*3/uL — ABNORMAL HIGH (ref 4.0–10.5)
nRBC: 0 % (ref 0.0–0.2)

## 2023-08-01 MED ORDER — ACETAMINOPHEN 500 MG PO TABS: 1000.0000 mg | ORAL_TABLET | Freq: Three times a day (TID) | ORAL | 0 refills | Status: AC | PRN

## 2023-08-01 MED ORDER — OXYCODONE HCL 5 MG PO TABS: 5.0000 mg | ORAL_TABLET | ORAL | 0 refills | Status: AC | PRN

## 2023-08-01 MED ORDER — IBUPROFEN 600 MG PO TABS: 600.0000 mg | ORAL_TABLET | Freq: Four times a day (QID) | ORAL | 1 refills | Status: AC | PRN

## 2023-08-01 NOTE — Progress Notes (Signed)
Subjective: Postpartum Day 1: Cesarean Delivery Patient reports tolerating PO and no problems voiding.    Objective: Vital signs in last 24 hours: Temp:  [97.6 F (36.4 C)-98.9 F (37.2 C)] 98 F (36.7 C) (09/26 1443) Pulse Rate:  [62-103] 73 (09/26 1443) Resp:  [16-20] 16 (09/26 1443) BP: (88-122)/(57-71) 88/57 (09/26 1443) SpO2:  [95 %-100 %] 100 % (09/26 1443)  Physical Exam:  General: alert, cooperative, and no distress Lochia: appropriate Uterine Fundus: firm Incision: Bandage is clean dry and intact  DVT Evaluation: No evidence of DVT seen on physical exam.  Recent Labs    08/01/23 0523  HGB 12.1  HCT 36.0    Assessment/Plan: Status post Cesarean section. Doing well postoperatively.  Continue current care. Patient is interested in discharge home on pod #2 if babies are ready for discharge  Breastfeeding - lactation consultant as needed.  Pt may shower - encouraged to remove pressure dressing in the shower   Gerald Leitz, MD 08/01/2023, 5:37 PM

## 2023-08-01 NOTE — Lactation Note (Signed)
This note was copied from a baby's chart. Lactation Consultation Note  Patient Name: Carolyn Bush [id number] Date: 08/01/2023 Age:41 hours Reason for consult: Initial assessment;Early term 37-38.6wks;Multiple gestation  P3- MOB states that infants have been nursing really well and she has no concerns. MOB's nipples are a little sore from the constant nursing, but she has been using coconut oil and lanolin to help. MOB was getting ready to feed the infants. MOB is using a twin breastfeeding pillow, with a regular pillow underneath for more support. FOB handed twin A first to MOB. She placed twin A on the right breast in the football hold. Twin A latched immediately and had a strong rhythmic suck. FOB then handed twin B to MOB's left side in the football hold. Twin B took a few seconds to latch. Twin B switched back and forth between chomping and nutritive sucking.  LC encouraged MOB to continue latching infants as she has been and switching breasts per infant after every feeding. LC reviewed CDC milk storage guidelines, LC services, feeding infants 8-12x in 24 hrs, and not allowing the infants to go over 3 hrs without a feeding.  Maternal Data Does the patient have breastfeeding experience prior to this delivery?: Yes How long did the patient breastfeed?: Over a year for both of her older biological children.  Feeding Mother's Current Feeding Choice: Breast Milk  LATCH Score Latch: Grasps breast easily, tongue down, lips flanged, rhythmical sucking.  Audible Swallowing: Spontaneous and intermittent  Type of Nipple: Everted at rest and after stimulation  Comfort (Breast/Nipple): Soft / non-tender  Hold (Positioning): No assistance needed to correctly position infant at breast.  LATCH Score: 10   Lactation Tools Discussed/Used Pump Education: Milk Storage  Interventions Interventions: Breast feeding basics reviewed;Support pillows;Education;LC Services  brochure  Discharge Discharge Education: Warning signs for feeding baby Pump: Hands Free;Personal (LC encouraged MOB to get a DEBP for extra stimulation and stronger suction.)  Consult Status Consult Status: Follow-up Date: 08/02/23 Follow-up type: In-patient    Dema Severin BS, IBCLC 08/01/2023, 2:53 PM

## 2023-08-01 NOTE — Anesthesia Postprocedure Evaluation (Signed)
Anesthesia Post Note  Patient: Carolyn Bush  Procedure(s) Performed: REPEAT CESAREAN SECTION MULTI-GESTATIONAL WITH BILATERAL SALPINGECTOMY (Bilateral: Abdomen) APPLICATION OF CELL SAVER (Abdomen)     Patient location during evaluation: PACU Anesthesia Type: Spinal Level of consciousness: awake and alert Pain management: pain level controlled Vital Signs Assessment: post-procedure vital signs reviewed and stable Respiratory status: spontaneous breathing and respiratory function stable Cardiovascular status: blood pressure returned to baseline and stable Postop Assessment: spinal receding Anesthetic complications: no   No notable events documented.  Last Vitals:  Vitals:   07/31/23 2356 08/01/23 0345  BP: 93/63 98/61  Pulse: (!) 103   Resp: 20 17  Temp: 36.7 C 36.6 C  SpO2:      Last Pain:  Vitals:   08/01/23 0345  TempSrc: Axillary  PainSc: 0-No pain                 Kennieth Rad

## 2023-08-02 LAB — SURGICAL PATHOLOGY

## 2023-08-02 NOTE — Discharge Summary (Signed)
Postpartum Discharge Summary  Date of Service updated 08/02/2023     Patient Name: Carolyn Bush DOB: [birth year] MRN: [id number]  Date of admission: 07/31/2023 Delivery date:   Carolyn, Bush [[id number]]  07/31/2023    Carolyn, Bush [[id number]]  07/31/2023 Delivering provider:    Kiaja, Bush [[id number]]  Gerald Leitz    Carolyn, Bush [[id number]]  Gerald Leitz Date of discharge: 08/02/2023  Admitting diagnosis: History of cesarean section complicating pregnancy [O34.219] Monochrionic / diamniotic twin pregnancy / desire for permanent sterilization  Intrauterine pregnancy: [redacted]w[redacted]d    monochroionic/ diamniotic twin pregnancy  Secondary diagnosis:  Principal Problem:   History of cesarean section complicating pregnancy  Additional problems: None    Discharge diagnosis: Term Pregnancy Delivered                                              Post partum procedures: None Augmentation: N/A Complications: None  Hospital course: Sceduled C/S   42 y.o. yo G3P3004 at [redacted]w[redacted]d was admitted to the hospital 07/31/2023 for scheduled cesarean section with the following indication: current monochorionic diamniotic twin pregnancy with history of cesarean section x 2 and desire for permanent sterilization .Delivery details are as follows:  Membrane Rupture Time/Date:    Carolyn, Bush [[id number]]  12:58 PM    Carolyn, Bush [[id number]]  12:58 PM,   Carolyn, Bush [[id number]]  07/31/2023    Carolyn, Bush [[id number]]  07/31/2023  Delivery Method:   Jamelle Haring [[id number]]  C-Section, Low Transverse    Carolyn, Bush [[id number]]  C-Section, Low Transverse Operative Delivery:N/A Details of operation can be found in separate operative note.  Patient had a postpartum course complicated by uncomplicated.  She is ambulating, tolerating a regular diet, passing flatus, and urinating well. Patient is discharged  home in stable condition on  08/02/23        Newborn Data: Birth date:   Carolyn, Bush [[id number]]  07/31/2023    Carolyn, Bush [[id number]]  07/31/2023 Birth time:   Carolyn, Bush [[id number]]  12:59 PM    Carolyn, Bush [[id number]]  12:58 PM Gender:   Carolyn, Bush [[id number]]  Female    Carolyn, Bush [[id number]]  Female Living status:   Carolyn, Bush [[id number]]  Living    Carolyn, Bush [[id number]]  Living Apgars:   Carolyn, Bush [[id number]]  [address] [[id number]]  5 ,   Carolyn, XVQMG Bush [[id number]]  [address] [[id number]]  9  Weight:   Carolyn, Bush [[id number]]  2890 g    Carolyn, Bush [[id number]]  2960 g    Magnesium Sulfate received: No BMZ received: No Rhophylac:N/A MMR:N/A T-DaP:Given prenatally Flu: Yes RSV Vaccine received: No Transfusion:No Immunizations administered: Immunization History  Administered Date(s) Administered   Rsv, Bivalent, Protein Subunit Rsvpref,pf Verdis Frederickson) 07/17/2023    Physical exam  Vitals:   08/01/23 0345 08/01/23 1443 08/01/23 2209 08/02/23 0554  BP: 98/61 (!) 88/57 (!) 87/51 98/64  Pulse:  73 66 65  Resp: 17 16 17 16   Temp: 97.8 F (36.6 C) 98 F (36.7 C) 98 F (36.7 C) 98.1 F (36.7 C)  TempSrc: Axillary Oral Oral Oral  SpO2:  100% 98% 98%  Weight:      Height:  General: alert, cooperative, and no distress Lochia: appropriate Uterine Fundus: firm Incision: Healing well with no significant drainage DVT Evaluation: No evidence of DVT seen on physical exam. Labs: Lab Results  Component Value Date   WBC 12.1 (H) 08/01/2023   HGB 12.1 08/01/2023   HCT 36.0 08/01/2023   MCV 92.1 08/01/2023   PLT 230 08/01/2023       No data to display         [place] Score:    08/01/2023    1:40 AM  Edinburgh Postnatal Depression Scale Screening Tool  I have  been able to laugh and see the funny side of things. 0  I have looked forward with enjoyment to things. 0  I have blamed myself unnecessarily when things went wrong. 0  I have been anxious or worried for no good reason. 0  I have felt scared or panicky for no good reason. 0  Things have been getting on top of me. 0  I have been so unhappy that I have had difficulty sleeping. 0  I have felt sad or miserable. 0  I have been so unhappy that I have been crying. 0  The thought of harming myself has occurred to me. 0  Edinburgh Postnatal Depression Scale Total 0      After visit meds:  Allergies as of 08/02/2023   No Known Allergies      Medication List     STOP taking these medications    aspirin EC 81 MG tablet   ferrous sulfate 324 MG Tbec       TAKE these medications    acetaminophen 500 MG tablet Commonly known as: TYLENOL Take 2 tablets (1,000 mg total) by mouth every 8 (eight) hours as needed.   ibuprofen 600 MG tablet Commonly known as: ADVIL Take 1 tablet (600 mg total) by mouth every 6 (six) hours as needed.   MAGNESIUM PO Take 1 tablet by mouth daily at 2 am. magnesium l-threonate   oxyCODONE 5 MG immediate release tablet Commonly known as: Oxy IR/ROXICODONE Take 1-2 tablets (5-10 mg total) by mouth every 4 (four) hours as needed for moderate pain or severe pain.   prenatal multivitamin Tabs tablet Take 1 tablet by mouth daily at 12 noon.   Vitamin D3 50 MCG (2000 UT) Tabs Take 2,000 Units by mouth daily.         Discharge home in stable condition Infant Feeding: Breast Infant Disposition:home with mother Discharge instruction: per After Visit Summary and Postpartum booklet. Activity: Advance as tolerated. Pelvic rest for 6 weeks.  Diet: routine diet Anticipated Birth Control:  bilateral salpingectomy performed at the time of cesarean section  Postpartum Appointment:2 weeks Additional Postpartum F/U: Incision check 2 weeks  Future  Appointments:No future appointments. Follow up Visit:  Follow-up Information     Gerald Leitz, MD. Schedule an appointment as soon as possible for a visit in 2 week(s).   Specialty: Obstetrics and Gynecology Why: If you do not have an appointment scheduled for incision check in 2 weeks please schedule an appointment Contact information: [address] [place] [address] [phone]                     08/02/2023 Gerald Leitz, MD

## 2023-08-02 NOTE — Lactation Note (Signed)
This note was copied from a baby's chart. Lactation Consultation Note  Patient Name: Carolyn Bush [id number] Date: 08/02/2023 Age:41 hours Reason for consult: Follow-up assessment;Mother's request;Early term 37-38.6wks;Breastfeeding assistance;Nipple pain/trauma;Infant weight loss (Baby A- 7.43% WL; Baby B-6.92% WL)  P3 Birth parent with 65 hour old twins.  Per the birth parent things are going well with breastfeeding.  She stated that one of the infant's bit down on her right nipple and she had been experiencing some pain.  The birth parent is using coconut oil and lanolin, but she asked about comfort gels.  LC explained to the birth parent that she would need to alternate between the oil and the comfort gels.  She stated that she would rather continue using the coconut oil.  The birth parent had no further questions or concerns.   Feeding Mother's Current Feeding Choice: Breast Milk  Interventions Interventions: Education  Consult Status Consult Status: Follow-up Date: 08/03/23 Follow-up type: In-patient   Delene Loll 08/02/2023, 1:39 PM

## 2023-08-05 ENCOUNTER — Other Ambulatory Visit: Payer: Self-pay

## 2023-08-06 ENCOUNTER — Other Ambulatory Visit: Payer: Self-pay

## 2023-08-07 ENCOUNTER — Other Ambulatory Visit: Payer: Self-pay

## 2023-08-07 MED FILL — Sodium Chloride IV Soln 0.9%: INTRAVENOUS | Qty: 2000 | Status: AC

## 2023-08-07 MED FILL — Heparin Sodium (Porcine) Inj 1000 Unit/ML: INTRAMUSCULAR | Qty: 10 | Status: AC

## 2023-08-27 ENCOUNTER — Telehealth (HOSPITAL_COMMUNITY): Payer: Self-pay | Admitting: *Deleted

## 2023-08-27 NOTE — Telephone Encounter (Signed)
08/27/2023  Name: Annavictoria Capurro MRN: [id number] DOB: 03-06-1981  Reason for Call:  Transition of Care Hospital Discharge Call  Contact Status: Patient Contact Status: Complete  Language assistant needed: Interpreter Mode: Interpreter Not Needed        Follow-Up Questions: Do You Have Any Concerns About Your Health As You Heal From Delivery?: No Do You Have Any Concerns About Your Infants Health?: No  Edinburgh Postnatal Depression Scale:  In the Past 7 Days:    PHQ2-9 Depression Scale:     Discharge Follow-up: Edinburgh score requires follow up?:  (declined screening today, took with OB - no concerns raised, endorses she feels well emotionally) Patient was advised of the following resources:: Breastfeeding Support Group, Support Group  Post-discharge interventions: Reviewed Newborn Safe Sleep Practices  Salena Saner, RN 08/27/2023 11:35

## 2023-09-12 DIAGNOSIS — Z98891 History of uterine scar from previous surgery: Secondary | ICD-10-CM | POA: Diagnosis not present

## 2023-09-12 DIAGNOSIS — Z4889 Encounter for other specified surgical aftercare: Secondary | ICD-10-CM | POA: Diagnosis not present

## 2023-10-10 DIAGNOSIS — R300 Dysuria: Secondary | ICD-10-CM | POA: Diagnosis not present

## 2023-10-10 DIAGNOSIS — N898 Other specified noninflammatory disorders of vagina: Secondary | ICD-10-CM | POA: Diagnosis not present

## 2023-10-18 ENCOUNTER — Other Ambulatory Visit (HOSPITAL_BASED_OUTPATIENT_CLINIC_OR_DEPARTMENT_OTHER): Payer: Self-pay

## 2023-10-18 MED ORDER — PREMARIN 0.625 MG/GM VA CREA
1.0000 g | TOPICAL_CREAM | Freq: Every evening | VAGINAL | 1 refills | Status: AC
  Filled 2023-10-18: qty 30, 30d supply, fill #0

## 2024-08-07 ENCOUNTER — Encounter (HOSPITAL_BASED_OUTPATIENT_CLINIC_OR_DEPARTMENT_OTHER): Payer: Self-pay | Admitting: Radiology

## 2024-08-07 ENCOUNTER — Other Ambulatory Visit (HOSPITAL_BASED_OUTPATIENT_CLINIC_OR_DEPARTMENT_OTHER): Payer: Self-pay | Admitting: Obstetrics and Gynecology

## 2024-08-07 ENCOUNTER — Ambulatory Visit (HOSPITAL_BASED_OUTPATIENT_CLINIC_OR_DEPARTMENT_OTHER)
Admission: RE | Admit: 2024-08-07 | Discharge: 2024-08-07 | Disposition: A | Discharge status: Home / Self Care | Source: Ambulatory Visit

## 2024-08-07 DIAGNOSIS — Z1231 Encounter for screening mammogram for malignant neoplasm of breast: Secondary | ICD-10-CM

## 2024-09-25 ENCOUNTER — Other Ambulatory Visit (HOSPITAL_COMMUNITY)
Admission: RE | Admit: 2024-09-25 | Discharge: 2024-09-25 | Disposition: A | Discharge status: Home / Self Care | Source: Ambulatory Visit | Attending: Obstetrics and Gynecology | Admitting: Obstetrics and Gynecology

## 2024-09-25 ENCOUNTER — Other Ambulatory Visit: Payer: Self-pay | Admitting: Obstetrics and Gynecology

## 2024-09-25 DIAGNOSIS — Z01419 Encounter for gynecological examination (general) (routine) without abnormal findings: Secondary | ICD-10-CM | POA: Insufficient documentation

## 2024-09-28 LAB — CYTOLOGY - PAP
Comment: NEGATIVE
Diagnosis: NEGATIVE
High risk HPV: NEGATIVE
